# Patient Record
Sex: Female | Born: 1990 | Hispanic: No | Marital: Single | Smoking: Never smoker
Health system: Southern US, Community
[De-identification: ages and names within clinical notes are randomized; demographics above are authoritative.]

## PROBLEM LIST (undated history)

## (undated) DIAGNOSIS — R52 Pain, unspecified: Secondary | ICD-10-CM

## (undated) DIAGNOSIS — G8929 Other chronic pain: Secondary | ICD-10-CM

## (undated) DIAGNOSIS — M329 Systemic lupus erythematosus, unspecified: Secondary | ICD-10-CM

## (undated) DIAGNOSIS — IMO0002 Reserved for concepts with insufficient information to code with codable children: Secondary | ICD-10-CM

---

## 2010-04-24 ENCOUNTER — Emergency Department (HOSPITAL_COMMUNITY): Admission: EM | Admit: 2010-04-24 | Discharge: 2010-04-24 | Payer: Self-pay | Admitting: Emergency Medicine

## 2011-07-19 ENCOUNTER — Emergency Department (HOSPITAL_COMMUNITY): Payer: Self-pay

## 2011-07-19 ENCOUNTER — Emergency Department (HOSPITAL_COMMUNITY)
Admission: EM | Admit: 2011-07-19 | Discharge: 2011-07-19 | Disposition: A | Discharge status: Home / Self Care | Payer: Self-pay | Attending: Emergency Medicine | Admitting: Emergency Medicine

## 2011-07-19 ENCOUNTER — Encounter: Payer: Self-pay | Admitting: *Deleted

## 2011-07-19 DIAGNOSIS — F17200 Nicotine dependence, unspecified, uncomplicated: Secondary | ICD-10-CM | POA: Insufficient documentation

## 2011-07-19 DIAGNOSIS — S0093XA Contusion of unspecified part of head, initial encounter: Secondary | ICD-10-CM

## 2011-07-19 DIAGNOSIS — M542 Cervicalgia: Secondary | ICD-10-CM | POA: Insufficient documentation

## 2011-07-19 DIAGNOSIS — S0003XA Contusion of scalp, initial encounter: Secondary | ICD-10-CM | POA: Insufficient documentation

## 2011-07-19 DIAGNOSIS — M549 Dorsalgia, unspecified: Secondary | ICD-10-CM | POA: Insufficient documentation

## 2011-07-19 DIAGNOSIS — S1093XA Contusion of unspecified part of neck, initial encounter: Secondary | ICD-10-CM | POA: Insufficient documentation

## 2011-07-19 DIAGNOSIS — G8929 Other chronic pain: Secondary | ICD-10-CM

## 2011-07-19 MED ORDER — HYDROMORPHONE HCL 1 MG/ML IJ SOLN
1.0000 mg | Freq: Once | INTRAMUSCULAR | Status: AC
  Administered 2011-07-19: 1 mg via INTRAMUSCULAR
  Filled 2011-07-19: qty 1

## 2011-07-19 MED ORDER — CYCLOBENZAPRINE HCL 10 MG PO TABS: 10.0000 mg | ORAL_TABLET | Freq: Two times a day (BID) | ORAL | Status: AC | PRN

## 2011-07-19 MED ORDER — NAPROXEN 500 MG PO TABS: 500.0000 mg | ORAL_TABLET | Freq: Two times a day (BID) | ORAL | Status: AC

## 2011-07-19 NOTE — ED Notes (Signed)
Pt upset with wait time. Apologized to pt for delay. Explained to pt that MD would be back in to see her as soon as possible.
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
## 2011-07-19 NOTE — ED Notes (Signed)
Patient comfortable does not need anything at this time. 
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
## 2011-07-19 NOTE — ED Notes (Signed)
PT returned from CT. States pain has improved down to 5/10 after Dilaudid. Resp even and unlabored. Awaiting radiology results.
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
## 2014-05-24 ENCOUNTER — Encounter (HOSPITAL_COMMUNITY): Payer: Self-pay | Admitting: Emergency Medicine

## 2014-05-24 ENCOUNTER — Emergency Department (HOSPITAL_COMMUNITY)
Admission: EM | Admit: 2014-05-24 | Discharge: 2014-05-24 | Disposition: A | Discharge status: Home / Self Care | Payer: Self-pay | Attending: Emergency Medicine | Admitting: Emergency Medicine

## 2014-05-24 DIAGNOSIS — G8929 Other chronic pain: Secondary | ICD-10-CM | POA: Insufficient documentation

## 2014-05-24 DIAGNOSIS — F17200 Nicotine dependence, unspecified, uncomplicated: Secondary | ICD-10-CM | POA: Insufficient documentation

## 2014-05-24 DIAGNOSIS — M79609 Pain in unspecified limb: Secondary | ICD-10-CM | POA: Insufficient documentation

## 2014-05-24 DIAGNOSIS — Z79899 Other long term (current) drug therapy: Secondary | ICD-10-CM | POA: Insufficient documentation

## 2014-05-24 HISTORY — DX: Reserved for concepts with insufficient information to code with codable children: IMO0002

## 2014-05-24 HISTORY — DX: Systemic lupus erythematosus, unspecified: M32.9

## 2014-05-24 NOTE — ED Notes (Signed)
Patient has hx of lupus and now has pain all over.

## 2014-05-24 NOTE — Discharge Instructions (Signed)
Chronic Pain Discharge Instructions  °Emergency care providers appreciate that many patients coming to us are in severe pain and we wish to address their pain in the safest, most responsible manner.  It is important to recognize however, that the proper treatment of chronic pain differs from that of the pain of injuries and acute illnesses.  Our goal is to provide quality, safe, personalized care and we thank you for giving us the opportunity to serve you. °The use of narcotics and related agents for chronic pain syndromes may lead to additional physical and psychological problems.  Nearly as many people die from prescription narcotics each year as die from car crashes.  Additionally, this risk is increased if such prescriptions are obtained from a variety of sources.  Therefore, only your primary care physician or a pain management specialist is able to safely treat such syndromes with narcotic medications long-term.   ° °Documentation revealing such prescriptions have been sought from multiple sources may prohibit us from providing a refill or different narcotic medication.  Your name may be checked first through the [place] Controlled Substances Reporting System.  This database is a record of controlled substance medication prescriptions that the patient has received.  This has been established by  in an effort to eliminate the dangerous, and often life threatening, practice of obtaining multiple prescriptions from different medical providers.  ° °If you have a chronic pain syndrome (i.e. chronic headaches, recurrent back or neck pain, dental pain, abdominal or pelvis pain without a specific diagnosis, or neuropathic pain such as fibromyalgia) or recurrent visits for the same condition without an acute diagnosis, you may be treated with non-narcotics and other non-addictive medicines.  Allergic reactions or negative side effects that may be reported by a patient to such medications will not  typically lead to the use of a narcotic analgesic or other controlled substance as an alternative. °  °Patients managing chronic pain with a personal physician should have provisions in place for breakthrough pain.  If you are in crisis, you should call your physician.  If your physician directs you to the emergency department, please have the doctor call and speak to our attending physician concerning your care. °  °When patients come to the Emergency Department (ED) with acute medical conditions in which the Emergency Department physician feels appropriate to prescribe narcotic or sedating pain medication, the physician will prescribe these in very limited quantities.  The amount of these medications will last only until you can see your primary care physician in his/her office.  Any patient who returns to the ED seeking refills should expect only non-narcotic pain medications.  ° °In the event of an acute medical condition exists and the emergency physician feels it is necessary that the patient be given a narcotic or sedating medication -  a responsible adult driver should be present in the room prior to the medication being given by the nurse. °  °Prescriptions for narcotic or sedating medications that have been lost, stolen or expired will not be refilled in the Emergency Department.   ° °Patients who have chronic pain may receive non-narcotic prescriptions until seen by their primary care physician.  It is every patient’s personal responsibility to maintain active prescriptions with his or her primary care physician or specialist. °

## 2014-05-24 NOTE — ED Provider Notes (Signed)
CSN: [id number]     Arrival date & time 05/24/14  0128 History   First MD Initiated Contact with Patient 05/24/14 0131     Chief Complaint  Patient presents with  . Extremity Pain    PAIN ALL OVER  FROM LUPUS     (Consider location/radiation/quality/duration/timing/severity/associated sxs/prior Treatment) HPI Patient reports pain all over.  She states this is secondary to her lupus.  She's tried ibuprofen without improvement in her symptoms.  She's currently without a primary care physician.  If fevers or chills.  No chest pain shortness of breath.  No abdominal pain.  No nausea vomiting or diarrhea.  Pain is mild to moderate in severity. no weakness of her arms or legs   Past Medical History  Diagnosis Date  . Lupus    No past surgical history on file. No family history on file. History  Substance Use Topics  . Smoking status: Current Some Day Smoker  . Smokeless tobacco: Not on file  . Alcohol Use: No   OB History   Grav Para Term Preterm Abortions TAB SAB Ect Mult Living                 Review of Systems  All other systems reviewed and are negative.     Allergies  Review of patient's allergies indicates no known allergies.  Home Medications   Prior to Admission medications   Medication Sig Start Date End Date Taking? Authorizing Provider  OXcarbazepine (TRILEPTAL) 150 MG tablet Take 150-300 mg by mouth 2 (two) times daily. TAKE ONE TABLET BY MOUTH IN THE MORNING AND TWO TABLETS AT BEDTIME     Historical Provider, MD   BP 124/99  Pulse 85  Temp(Src) 98.3 F (36.8 C) (Oral)  Resp 18  Ht 5\' 4"  (1.626 m)  Wt 135 lb (61.236 kg)  BMI 23.16 kg/m2  SpO2 100% Physical Exam  Nursing note and vitals reviewed. Constitutional: She is oriented to person, place, and time. She appears well-developed and well-nourished. No distress.  HENT:  Head: Normocephalic and atraumatic.  Eyes: EOM are normal.  Neck: Normal range of motion.  Cardiovascular: Normal rate, regular  rhythm and normal heart sounds.   Pulmonary/Chest: Effort normal and breath sounds normal.  Abdominal: Soft. She exhibits no distension. There is no tenderness.  Musculoskeletal: Normal range of motion.  Neurological: She is alert and oriented to person, place, and time.  Skin: Skin is warm and dry.  Psychiatric: She has a normal mood and affect. Judgment normal.    ED Course  Procedures (including critical care time) Labs Review Labs Reviewed - No data to display  Imaging Review No results found.   EKG Interpretation None      MDM   Final diagnoses:  Chronic pain    Suspect chronic pain exacerbation.  No acute illness.  Medical screening examination completed.    Lyanne CoKevin M Campos, MD 05/24/14 [phone]

## 2016-12-14 ENCOUNTER — Encounter (HOSPITAL_COMMUNITY): Payer: Self-pay

## 2016-12-14 ENCOUNTER — Emergency Department (HOSPITAL_COMMUNITY)
Admission: EM | Admit: 2016-12-14 | Discharge: 2016-12-14 | Disposition: A | Discharge status: Home / Self Care | Payer: BLUE CROSS/BLUE SHIELD | Attending: Emergency Medicine | Admitting: Emergency Medicine

## 2016-12-14 DIAGNOSIS — J019 Acute sinusitis, unspecified: Secondary | ICD-10-CM | POA: Insufficient documentation

## 2016-12-14 DIAGNOSIS — F17210 Nicotine dependence, cigarettes, uncomplicated: Secondary | ICD-10-CM | POA: Diagnosis not present

## 2016-12-14 DIAGNOSIS — R05 Cough: Secondary | ICD-10-CM | POA: Diagnosis present

## 2016-12-14 MED ORDER — AMOXICILLIN-POT CLAVULANATE 500-125 MG PO TABS
1.0000 | ORAL_TABLET | Freq: Once | ORAL | Status: AC
  Administered 2016-12-14: 500 mg via ORAL

## 2016-12-14 MED ORDER — PREDNISONE 10 MG PO TABS
ORAL_TABLET | ORAL | Status: AC
  Filled 2016-12-14: qty 1

## 2016-12-14 MED ORDER — AMOXICILLIN-POT CLAVULANATE 500-125 MG PO TABS
ORAL_TABLET | ORAL | Status: AC
  Filled 2016-12-14: qty 1

## 2016-12-14 MED ORDER — PREDNISONE 50 MG PO TABS
ORAL_TABLET | ORAL | Status: AC
  Filled 2016-12-14: qty 1

## 2016-12-14 MED ORDER — PSEUDOEPHEDRINE HCL 60 MG PO TABS: 60.0000 mg | ORAL_TABLET | Freq: Four times a day (QID) | ORAL | 0 refills | Status: DC | PRN

## 2016-12-14 MED ORDER — AMOXICILLIN-POT CLAVULANATE 500-125 MG PO TABS: 1.0000 | ORAL_TABLET | Freq: Two times a day (BID) | ORAL | 0 refills | Status: DC

## 2016-12-14 MED ORDER — PREDNISONE 50 MG PO TABS
60.0000 mg | ORAL_TABLET | Freq: Once | ORAL | Status: AC
  Administered 2016-12-14: 60 mg via ORAL

## 2016-12-14 NOTE — ED Triage Notes (Signed)
My head has been bothering me and I have cough and cold symptoms that seem to be going into my chest.  The last couple of days I have had swelling around my eyes.  Friday I had a fever and the next day my eyes were swollen.  I have been using mucinex and dayquil.

## 2016-12-14 NOTE — ED Provider Notes (Signed)
[facility]EM[facility]PT Provider Note   CSN: [id number] Arrival date & time: 12/14/16  2059     History   Chief Complaint No chief complaint on file.   HPI Kara Scott is a 26 y.o. female.  HPI   Kara Scott is a 26 y.o. female who presents to the Emergency Department complaining of facial pain and sinus pressure.  She reports having nasal congestion, cough and "puffiness" around her eyes.  symptoms have been present for 4 days.  She has taken Mucinex and dayquil without relief.  Fever at onset, but not since.  She also admits to occasional cough that has been non-productive.  She denies chest pain, shortness of breath, abd pain, vomiting, headache, neck pain or stiffness     Past Medical History:  Diagnosis Date  . Lupus     There are no active problems to display for this patient.   History reviewed. No pertinent surgical history.  OB History    No data available       Home Medications    Prior to Admission medications   Medication Sig Start Date End Date Taking? Authorizing Provider  OXcarbazepine (TRILEPTAL) 150 MG tablet Take 150-300 mg by mouth 2 (two) times daily. TAKE ONE TABLET BY MOUTH IN THE MORNING AND TWO TABLETS AT BEDTIME     Historical Provider, MD    Family History No family history on file.  Social History Social History  Substance Use Topics  . Smoking status: Current Some Day Smoker    Packs/day: 0.50    Years: 5.00    Types: Cigarettes  . Smokeless tobacco: Never Used  . Alcohol use No     Allergies   Patient has no known allergies.   Review of Systems Review of Systems  Constitutional: Positive for fever. Negative for activity change, appetite change and chills.  HENT: Positive for congestion, rhinorrhea, sinus pain, sinus pressure and sore throat. Negative for facial swelling and trouble swallowing.   Eyes: Negative for visual disturbance.  Respiratory: Positive for cough. Negative for chest tightness, shortness of breath,  wheezing and stridor.   Cardiovascular: Negative for chest pain.  Gastrointestinal: Negative for abdominal pain, nausea and vomiting.  Musculoskeletal: Negative for arthralgias, neck pain and neck stiffness.  Skin: Negative.   Neurological: Negative for dizziness, weakness, numbness and headaches.  Hematological: Negative for adenopathy.  Psychiatric/Behavioral: Negative for confusion.  All other systems reviewed and are negative.    Physical Exam Updated Vital Signs BP 122/98 (BP Location: Right Arm)   Pulse 72   Temp 98.4 F (36.9 C) (Oral)   Resp 20   Ht 5\' 5"  (1.651 m)   Wt 63.5 kg   LMP 12/06/2016 (Approximate)   SpO2 100%   BMI 23.30 kg/m   Physical Exam  Constitutional: She is oriented to person, place, and time. She appears well-developed and well-nourished. No distress.  HENT:  Head: Normocephalic and atraumatic.  Right Ear: Tympanic membrane and ear canal normal.  Left Ear: Tympanic membrane and ear canal normal.  Nose: Mucosal edema present. Right sinus exhibits maxillary sinus tenderness. Left sinus exhibits maxillary sinus tenderness.  Mouth/Throat: Uvula is midline and mucous membranes are normal. No trismus in the jaw. No uvula swelling or dental caries. Posterior oropharyngeal erythema present. No oropharyngeal exudate, posterior oropharyngeal edema or tonsillar abscesses.  Eyes: Conjunctivae are normal.  Neck: Normal range of motion and phonation normal. Neck supple. No Brudzinski's sign and no Kernig's sign noted.  Cardiovascular: Normal rate,  regular rhythm and normal heart sounds.   Pulmonary/Chest: Effort normal and breath sounds normal. No respiratory distress. She has no wheezes. She has no rales.  Abdominal: Soft. She exhibits no distension. There is no tenderness. There is no rebound and no guarding.  Musculoskeletal: She exhibits no edema.  Lymphadenopathy:    She has no cervical adenopathy.  Neurological: She is alert and oriented to person, place,  and time. She exhibits normal muscle tone. Coordination normal.  Skin: Skin is warm and dry. No rash noted.  Psychiatric: She has a normal mood and affect.  Nursing note and vitals reviewed.    ED Treatments / Results  Labs (all labs ordered are listed, but only abnormal results are displayed) Labs Reviewed - No data to display  EKG  EKG Interpretation None       Radiology No results found.  Procedures Procedures (including critical care time)  Medications Ordered in ED Medications  predniSONE (DELTASONE) 50 MG tablet (not administered)  predniSONE (DELTASONE) 10 MG tablet (not administered)  amoxicillin-clavulanate (AUGMENTIN) 500-125 MG per tablet 500 mg (500 mg Oral Given 12/14/16 2157)  predniSONE (DELTASONE) tablet 60 mg (60 mg Oral Given 12/14/16 2157)     Initial Impression / Assessment and Plan / ED Course  I have reviewed the triage vital signs and the nursing notes.  Pertinent labs & imaging results that were available during my care of the patient were reviewed by me and considered in my medical decision making (see chart for details).  Clinical Course     Pt is well appearing.  Vitals stable.  Sx's likely related to sinusitis.  Agrees to OTC Afrin x 3 days then d/c, fluids.  Ibuprofen if needed.  Rx for augmentin and sudafed.    Final Clinical Impressions(s) / ED Diagnoses   Final diagnoses:  Acute sinusitis, recurrence not specified, unspecified location    New Prescriptions New Prescriptions   No medications on file     Pauline Ausammy Joni Norrod, Cordelia Poche-C 12/14/16 2215    Vanetta MuldersScott Zackowski, MD 12/19/16 [phone]

## 2016-12-14 NOTE — Discharge Instructions (Signed)
Use OTC afrin nasal spray one-two sprays to each nostril 2 times a day for 3 days then stop.  Increase fluids.  Start the Augmentin tomorrow.  Follow-up with your doctor for recheck or return here for any worsening symptoms

## 2017-08-16 ENCOUNTER — Encounter (HOSPITAL_COMMUNITY): Payer: Self-pay

## 2017-08-16 ENCOUNTER — Emergency Department (HOSPITAL_COMMUNITY)
Admission: EM | Admit: 2017-08-16 | Discharge: 2017-08-16 | Disposition: A | Discharge status: Home / Self Care | Payer: BLUE CROSS/BLUE SHIELD | Attending: Emergency Medicine | Admitting: Emergency Medicine

## 2017-08-16 DIAGNOSIS — F419 Anxiety disorder, unspecified: Secondary | ICD-10-CM

## 2017-08-16 DIAGNOSIS — R1084 Generalized abdominal pain: Secondary | ICD-10-CM

## 2017-08-16 DIAGNOSIS — Z79899 Other long term (current) drug therapy: Secondary | ICD-10-CM | POA: Insufficient documentation

## 2017-08-16 DIAGNOSIS — Z87891 Personal history of nicotine dependence: Secondary | ICD-10-CM | POA: Insufficient documentation

## 2017-08-16 DIAGNOSIS — R110 Nausea: Secondary | ICD-10-CM | POA: Insufficient documentation

## 2017-08-16 LAB — URINALYSIS, ROUTINE W REFLEX MICROSCOPIC
Bilirubin Urine: NEGATIVE
Glucose, UA: NEGATIVE mg/dL
Ketones, ur: NEGATIVE mg/dL
Leukocytes, UA: NEGATIVE
Nitrite: NEGATIVE
Protein, ur: NEGATIVE mg/dL
SPECIFIC GRAVITY, URINE: 1.02 (ref 1.005–1.030)
pH: 5 (ref 5.0–8.0)

## 2017-08-16 LAB — COMPREHENSIVE METABOLIC PANEL
ALBUMIN: 4.1 g/dL (ref 3.5–5.0)
ALT: 19 U/L (ref 14–54)
AST: 27 U/L (ref 15–41)
Alkaline Phosphatase: 43 U/L (ref 38–126)
Anion gap: 6 (ref 5–15)
BILIRUBIN TOTAL: 0.4 mg/dL (ref 0.3–1.2)
BUN: 11 mg/dL (ref 6–20)
CHLORIDE: 108 mmol/L (ref 101–111)
CO2: 25 mmol/L (ref 22–32)
CREATININE: 0.8 mg/dL (ref 0.44–1.00)
Calcium: 9 mg/dL (ref 8.9–10.3)
GFR calc Af Amer: >60 mL/min (ref >60)
GLUCOSE: 121 mg/dL — AB (ref 65–99)
POTASSIUM: 3.5 mmol/L (ref 3.5–5.1)
Sodium: 139 mmol/L (ref 135–145)
Total Protein: 7 g/dL (ref 6.5–8.1)

## 2017-08-16 LAB — CBC
HCT: 35.5 % — ABNORMAL LOW (ref 36.0–46.0)
Hemoglobin: 12 g/dL (ref 12.0–15.0)
MCH: 27.9 pg (ref 26.0–34.0)
MCHC: 33.8 g/dL (ref 30.0–36.0)
MCV: 82.6 fL (ref 78.0–100.0)
Platelets: 257 10*3/uL (ref 150–400)
RBC: 4.3 MIL/uL (ref 3.87–5.11)
RDW: 12.8 % (ref 11.5–15.5)
WBC: 7.4 10*3/uL (ref 4.0–10.5)

## 2017-08-16 LAB — RAPID URINE DRUG SCREEN, HOSP PERFORMED
AMPHETAMINES: NOT DETECTED
BENZODIAZEPINES: NOT DETECTED
Barbiturates: NOT DETECTED
COCAINE: NOT DETECTED
OPIATES: NOT DETECTED
Tetrahydrocannabinol: NOT DETECTED

## 2017-08-16 LAB — LIPASE, BLOOD: LIPASE: 24 U/L (ref 11–51)

## 2017-08-16 LAB — PREGNANCY, URINE: PREG TEST UR: NEGATIVE

## 2017-08-16 MED ORDER — SODIUM CHLORIDE 0.9 % IV BOLUS (SEPSIS)
1000.0000 mL | Freq: Once | INTRAVENOUS | Status: AC
  Administered 2017-08-16: 1000 mL via INTRAVENOUS

## 2017-08-16 MED ORDER — PROMETHAZINE HCL 25 MG PO TABS: 25.0000 mg | ORAL_TABLET | Freq: Four times a day (QID) | ORAL | 0 refills | Status: DC | PRN

## 2017-08-16 MED ORDER — ONDANSETRON HCL 4 MG/2ML IJ SOLN
4.0000 mg | Freq: Once | INTRAMUSCULAR | Status: AC
  Administered 2017-08-16: 4 mg via INTRAVENOUS
  Filled 2017-08-16: qty 2

## 2017-08-16 MED ORDER — HYDROXYZINE HCL 25 MG PO TABS: 25.0000 mg | ORAL_TABLET | Freq: Four times a day (QID) | ORAL | 0 refills | Status: DC | PRN

## 2017-08-16 MED ORDER — LORAZEPAM 2 MG/ML IJ SOLN
0.5000 mg | Freq: Once | INTRAMUSCULAR | Status: AC
  Administered 2017-08-16: 0.5 mg via INTRAVENOUS
  Filled 2017-08-16: qty 1

## 2017-08-16 NOTE — ED Provider Notes (Signed)
[facility] Provider Note   CSN: [id number] Arrival date & time: 08/16/17  1803     History   Chief Complaint Chief Complaint  Patient presents with  . Abdominal Pain  . Anxiety    HPI Kara Scott is a 26 y.o. female.  Pt presents to the ED today with abd pain, n/v, and diarrhea.  Pt also feels like her "nerves are bad."  The pt said she woke up this way this morning.  Pt does have a hx of anxiety, but she's never felt this bad with it in the past.  Pt said she's not had an appetite for the past few days.      Past Medical History:  Diagnosis Date  . Lupus     There are no active problems to display for this patient.   History reviewed. No pertinent surgical history.  OB History    No data available       Home Medications    Prior to Admission medications   Medication Sig Start Date End Date Taking? Authorizing Provider  hydrOXYzine (ATARAX/VISTARIL) 25 MG tablet Take 1 tablet (25 mg total) by mouth every 6 (six) hours as needed for anxiety. 08/16/17   Jacalyn Lefevre, MD  OXcarbazepine (TRILEPTAL) 150 MG tablet Take 150-300 mg by mouth 2 (two) times daily. TAKE ONE TABLET BY MOUTH IN THE MORNING AND TWO TABLETS AT BEDTIME     [provider]  promethazine (PHENERGAN) 25 MG tablet Take 1 tablet (25 mg total) by mouth every 6 (six) hours as needed for nausea or vomiting. 08/16/17   Jacalyn Lefevre, MD    Family History No family history on file.  Social History Social History  Substance Use Topics  . Smoking status: Former Smoker    Packs/day: 0.00    Years: 5.00    Types: Cigarettes  . Smokeless tobacco: Never Used  . Alcohol use No     Allergies   Patient has no known allergies.   Review of Systems Review of Systems  Gastrointestinal: Positive for abdominal pain, diarrhea, nausea and vomiting.  Psychiatric/Behavioral: The patient is nervous/anxious.   All other systems reviewed and are negative.    Physical Exam Updated  Vital Signs BP 137/84 (BP Location: Right Arm)   Pulse 77   Temp 98.2 F (36.8 C) (Oral)   Resp 16   Ht  (1.626 m)   Wt 63.5 kg (140 lb)   LMP 08/09/2017   SpO2 100%   BMI 24.03 kg/m   Physical Exam  Constitutional: She is oriented to person, place, and time. She appears well-developed and well-nourished.  HENT:  Head: Normocephalic and atraumatic.  Right Ear: External ear normal.  Left Ear: External ear normal.  Nose: Nose normal.  Mouth/Throat: Oropharynx is clear and moist.  Eyes: Pupils are equal, round, and reactive to light. Conjunctivae and EOM are normal.  Neck: Normal range of motion. Neck supple.  Cardiovascular: Normal rate, regular rhythm, normal heart sounds and intact distal pulses.   Pulmonary/Chest: Effort normal and breath sounds normal.  Abdominal: Soft. Bowel sounds are normal.  Musculoskeletal: Normal range of motion.  Neurological: She is alert and oriented to person, place, and time.  Skin: Skin is warm.  Psychiatric: She has a normal mood and affect. Her behavior is normal. Thought content normal.  Nursing note and vitals reviewed.    ED Treatments / Results  Labs (all labs ordered are listed, but only abnormal results are displayed) Labs Reviewed  COMPREHENSIVE METABOLIC PANEL - Abnormal; Notable for the following:       Result Value   Glucose, Bld 121 (*)    All other components within normal limits  CBC - Abnormal; Notable for the following:    HCT 35.5 (*)    All other components within normal limits  URINALYSIS, ROUTINE W REFLEX MICROSCOPIC - Abnormal; Notable for the following:    Hgb urine dipstick SMALL (*)    Bacteria, UA RARE (*)    Squamous Epithelial / LPF 0-5 (*)    All other components within normal limits  LIPASE, BLOOD  PREGNANCY, URINE  RAPID URINE DRUG SCREEN, HOSP PERFORMED    EKG  EKG Interpretation None       Radiology No results found.  Procedures Procedures (including critical care  time)  Medications Ordered in ED Medications  ondansetron (ZOFRAN) injection 4 mg (4 mg Intravenous Given 08/16/17 1909)  sodium chloride 0.9 % bolus 1,000 mL (1,000 mLs Intravenous New Bag/Given 08/16/17 1909)  LORazepam (ATIVAN) injection 0.5 mg (0.5 mg Intravenous Given 08/16/17 1909)     Initial Impression / Assessment and Plan / ED Course  I have reviewed the triage vital signs and the nursing notes.  Pertinent labs & imaging results that were available during my care of the patient were reviewed by me and considered in my medical decision making (see chart for details).    Pt feeling much better after ativan and ivfs.  She knows to return if worse.  She does not have a pcp, so [facility]  Final Clinical Impressions(s) / ED Diagnoses   Final diagnoses:  Anxiety  Generalized abdominal pain    New Prescriptions New Prescriptions   HYDROXYZINE (ATARAX/VISTARIL) 25 MG TABLET    Take 1 tablet (25 mg total) by mouth every 6 (six) hours as needed for anxiety.   PROMETHAZINE (PHENERGAN) 25 MG TABLET    Take 1 tablet (25 mg total) by mouth every 6 (six) hours as needed for nausea or vomiting.     Jacalyn LefevreHaviland, Miguelangel Korn, MD 08/16/17 [phone]

## 2017-08-16 NOTE — ED Triage Notes (Signed)
Reports of waking up today with abdominal pain, nausea and diarrhea. States her "nerves are bad." Has not taken anything for anxiety.

## 2017-11-02 ENCOUNTER — Other Ambulatory Visit: Payer: Self-pay

## 2017-11-02 ENCOUNTER — Encounter (HOSPITAL_COMMUNITY): Payer: Self-pay | Admitting: *Deleted

## 2017-11-02 ENCOUNTER — Emergency Department (HOSPITAL_COMMUNITY): Payer: Self-pay

## 2017-11-02 ENCOUNTER — Emergency Department (HOSPITAL_COMMUNITY)
Admission: EM | Admit: 2017-11-02 | Discharge: 2017-11-02 | Disposition: A | Discharge status: Home / Self Care | Payer: Self-pay | Attending: Emergency Medicine | Admitting: Emergency Medicine

## 2017-11-02 DIAGNOSIS — R0981 Nasal congestion: Secondary | ICD-10-CM | POA: Insufficient documentation

## 2017-11-02 DIAGNOSIS — R197 Diarrhea, unspecified: Secondary | ICD-10-CM | POA: Insufficient documentation

## 2017-11-02 DIAGNOSIS — B9789 Other viral agents as the cause of diseases classified elsewhere: Secondary | ICD-10-CM | POA: Insufficient documentation

## 2017-11-02 DIAGNOSIS — Z79899 Other long term (current) drug therapy: Secondary | ICD-10-CM | POA: Insufficient documentation

## 2017-11-02 DIAGNOSIS — R112 Nausea with vomiting, unspecified: Secondary | ICD-10-CM

## 2017-11-02 DIAGNOSIS — Z87891 Personal history of nicotine dependence: Secondary | ICD-10-CM | POA: Insufficient documentation

## 2017-11-02 DIAGNOSIS — J9801 Acute bronchospasm: Secondary | ICD-10-CM | POA: Insufficient documentation

## 2017-11-02 DIAGNOSIS — R05 Cough: Secondary | ICD-10-CM | POA: Insufficient documentation

## 2017-11-02 DIAGNOSIS — J069 Acute upper respiratory infection, unspecified: Secondary | ICD-10-CM | POA: Insufficient documentation

## 2017-11-02 LAB — URINALYSIS, ROUTINE W REFLEX MICROSCOPIC
Bilirubin Urine: NEGATIVE
Glucose, UA: NEGATIVE mg/dL
Ketones, ur: NEGATIVE mg/dL
Leukocytes, UA: NEGATIVE
NITRITE: NEGATIVE
PROTEIN: NEGATIVE mg/dL
Specific Gravity, Urine: 1.015 (ref 1.005–1.030)
pH: 6 (ref 5.0–8.0)

## 2017-11-02 LAB — COMPREHENSIVE METABOLIC PANEL
ALBUMIN: 4.4 g/dL (ref 3.5–5.0)
ALT: 22 U/L (ref 14–54)
ANION GAP: 9 (ref 5–15)
AST: 29 U/L (ref 15–41)
Alkaline Phosphatase: 52 U/L (ref 38–126)
BUN: 14 mg/dL (ref 6–20)
CO2: 21 mmol/L — AB (ref 22–32)
Calcium: 9.3 mg/dL (ref 8.9–10.3)
Chloride: 106 mmol/L (ref 101–111)
Creatinine, Ser: 0.79 mg/dL (ref 0.44–1.00)
GFR calc Af Amer: >60 mL/min (ref >60)
GFR calc non Af Amer: >60 mL/min (ref >60)
Glucose, Bld: 103 mg/dL — ABNORMAL HIGH (ref 65–99)
POTASSIUM: 3.6 mmol/L (ref 3.5–5.1)
SODIUM: 136 mmol/L (ref 135–145)
TOTAL PROTEIN: 7.8 g/dL (ref 6.5–8.1)
Total Bilirubin: 0.6 mg/dL (ref 0.3–1.2)

## 2017-11-02 LAB — CBC
HEMATOCRIT: 43.5 % (ref 36.0–46.0)
HEMOGLOBIN: 13.9 g/dL (ref 12.0–15.0)
MCH: 26.7 pg (ref 26.0–34.0)
MCHC: 32 g/dL (ref 30.0–36.0)
MCV: 83.7 fL (ref 78.0–100.0)
Platelets: 274 10*3/uL (ref 150–400)
RBC: 5.2 MIL/uL — ABNORMAL HIGH (ref 3.87–5.11)
RDW: 13.3 % (ref 11.5–15.5)
WBC: 5.9 10*3/uL (ref 4.0–10.5)

## 2017-11-02 LAB — LIPASE, BLOOD: LIPASE: 28 U/L (ref 11–51)

## 2017-11-02 LAB — PREGNANCY, URINE: PREG TEST UR: NEGATIVE

## 2017-11-02 MED ORDER — ALBUTEROL (5 MG/ML) CONTINUOUS INHALATION SOLN
10.0000 mg/h | INHALATION_SOLUTION | RESPIRATORY_TRACT | Status: DC
  Administered 2017-11-02: 10 mg/h via RESPIRATORY_TRACT
  Filled 2017-11-02: qty 20

## 2017-11-02 MED ORDER — ONDANSETRON HCL 4 MG PO TABS: 4.0000 mg | ORAL_TABLET | Freq: Three times a day (TID) | ORAL | 0 refills | Status: DC | PRN

## 2017-11-02 MED ORDER — ALBUTEROL SULFATE (2.5 MG/3ML) 0.083% IN NEBU
5.0000 mg | INHALATION_SOLUTION | Freq: Once | RESPIRATORY_TRACT | Status: AC
  Administered 2017-11-02: 5 mg via RESPIRATORY_TRACT
  Filled 2017-11-02: qty 6

## 2017-11-02 MED ORDER — ALBUTEROL SULFATE HFA 108 (90 BASE) MCG/ACT IN AERS: 1.0000 | INHALATION_SPRAY | RESPIRATORY_TRACT | Status: DC | PRN

## 2017-11-02 MED ORDER — SODIUM CHLORIDE 0.9 % IV BOLUS (SEPSIS)
1000.0000 mL | Freq: Once | INTRAVENOUS | Status: AC
  Administered 2017-11-02: 1000 mL via INTRAVENOUS

## 2017-11-02 MED ORDER — GI COCKTAIL ~~LOC~~
30.0000 mL | Freq: Once | ORAL | Status: AC
  Administered 2017-11-02: 30 mL via ORAL
  Filled 2017-11-02: qty 30

## 2017-11-02 MED ORDER — PREDNISONE 20 MG PO TABS: ORAL_TABLET | ORAL | 0 refills | Status: DC

## 2017-11-02 MED ORDER — ONDANSETRON HCL 4 MG/2ML IJ SOLN
4.0000 mg | Freq: Once | INTRAMUSCULAR | Status: AC
Start: 2017-11-02 — End: 2017-11-02
  Administered 2017-11-02: 4 mg via INTRAVENOUS
  Filled 2017-11-02: qty 2

## 2017-11-02 MED ORDER — METHYLPREDNISOLONE SODIUM SUCC 125 MG IJ SOLR
125.0000 mg | Freq: Once | INTRAMUSCULAR | Status: AC
  Administered 2017-11-02: 125 mg via INTRAVENOUS
  Filled 2017-11-02: qty 2

## 2017-11-02 NOTE — ED Triage Notes (Addendum)
Pt c/o 2-3 days of vomiting, diarrhea, weakness, yellow colored productive cough, wheezing, nasal congestion, chills, body aches. Unknown fever.

## 2017-11-02 NOTE — ED Provider Notes (Signed)
[facility] EMERGENCY DEPARTMENT Provider Note   CSN: [id number] Arrival date & time: 11/02/17  1217     History   Chief Complaint Chief Complaint  Patient presents with  . Cough  . Emesis    HPI Kara Scott is a 26 y.o. female.  HPI Patient presents with 3 days of vomiting and diarrhea.  Denies any blood in stool or vomit.  Multiple S episodes daily.  Patient says she is also having nasal congestion, sinus pressure, sore throat, productive cough, wheezing and shortness of breath.  Denies neck pain or stiffness. Past Medical History:  Diagnosis Date  . Lupus     There are no active problems to display for this patient.   History reviewed. No pertinent surgical history.  OB History    No data available       Home Medications    Prior to Admission medications   Medication Sig Start Date End Date Taking? Authorizing Provider  hydrOXYzine (ATARAX/VISTARIL) 25 MG tablet Take 1 tablet (25 mg total) by mouth every 6 (six) hours as needed for anxiety. 08/16/17   Jacalyn Lefevre, MD  ondansetron (ZOFRAN) 4 MG tablet Take 1 tablet (4 mg total) by mouth every 8 (eight) hours as needed for nausea or vomiting. 11/02/17   Loren Racer, MD  predniSONE (DELTASONE) 20 MG tablet 3 tabs po day one, then 2 po daily x 4 days 11/03/17   Loren Racer, MD  promethazine (PHENERGAN) 25 MG tablet Take 1 tablet (25 mg total) by mouth every 6 (six) hours as needed for nausea or vomiting. 08/16/17   Jacalyn Lefevre, MD    Family History No family history on file.  Social History Social History   Tobacco Use  . Smoking status: Former Smoker    Packs/day: 0.00    Years: 5.00    Pack years: 0.00    Types: Cigarettes  . Smokeless tobacco: Never Used  Substance Use Topics  . Alcohol use: No  . Drug use: No     Allergies   Patient has no known allergies.   Review of Systems Review of Systems  Constitutional: Negative for chills and fever.  HENT: Positive for congestion,  sinus pressure and sore throat.   Respiratory: Positive for cough, shortness of breath and wheezing.   Cardiovascular: Negative for chest pain, palpitations and leg swelling.  Gastrointestinal: Positive for diarrhea, nausea and vomiting. Negative for abdominal pain and blood in stool.  Genitourinary: Negative for dysuria and flank pain.  Musculoskeletal: Negative for back pain, joint swelling, myalgias, neck pain and neck stiffness.  Skin: Negative for rash and wound.  Neurological: Negative for dizziness, weakness, light-headedness, numbness and headaches.  All other systems reviewed and are negative.    Physical Exam Updated Vital Signs BP 114/70 (BP Location: Right Arm)   Pulse 95   Temp 97.6 F (36.4 C) (Oral)   Resp 18   Ht 5\' 4"  (1.626 m)   Wt 63.5 kg (140 lb)   LMP  (Within Weeks)   SpO2 99%   BMI 24.03 kg/m   Physical Exam  Constitutional: She is oriented to person, place, and time. She appears well-developed and well-nourished.  HENT:  Head: Normocephalic and atraumatic.  Mouth/Throat: Oropharynx is clear and moist.  Bilateral nasal mucosal edema.  Oropharynx is mildly erythematous.  Uvula is midline.  No tonsillar exudates.  Eyes: EOM are normal. Pupils are equal, round, and reactive to light.  Neck: Normal range of motion. Neck supple.  No  meningismus  Cardiovascular: Normal rate and regular rhythm. Exam reveals no gallop and no friction rub.  No murmur heard. Pulmonary/Chest: Effort normal. She has wheezes.  Diffuse expiratory wheezing throughout.  Abdominal: Soft. Bowel sounds are normal. There is tenderness. There is no rebound and no guarding.  Mild epigastric tenderness with palpation.  Musculoskeletal: Normal range of motion. She exhibits no edema or tenderness.  No midline thoracic or lumbar tenderness.  No CVA tenderness.  No lower extremity swelling, asymmetry or tenderness.  Lymphadenopathy:    She has no cervical adenopathy.  Neurological: She is  alert and oriented to person, place, and time.  5/5 motor in all extremities.  Sensation fully intact.  Skin: Skin is warm and dry. Capillary refill takes less than 2 seconds. No rash noted. No erythema.  Psychiatric: She has a normal mood and affect. Her behavior is normal.  Nursing note and vitals reviewed.    ED Treatments / Results  Labs (all labs ordered are listed, but only abnormal results are displayed) Labs Reviewed  COMPREHENSIVE METABOLIC PANEL - Abnormal; Notable for the following components:      Result Value   CO2 21 (*)    Glucose, Bld 103 (*)    All other components within normal limits  CBC - Abnormal; Notable for the following components:   RBC 5.20 (*)    All other components within normal limits  URINALYSIS, ROUTINE W REFLEX MICROSCOPIC - Abnormal; Notable for the following components:   Hgb urine dipstick SMALL (*)    Bacteria, UA RARE (*)    Squamous Epithelial / LPF 0-5 (*)    All other components within normal limits  LIPASE, BLOOD  PREGNANCY, URINE    EKG  EKG Interpretation None       Radiology Dg Chest 2 View  Result Date: 11/02/2017 CLINICAL DATA:  Flu like symptoms, vomiting, weakness, shortness of breath, wheezing, productive cough, and dehydration for 2-3 days, history asthma EXAM: CHEST  2 VIEW COMPARISON:  None FINDINGS: Normal heart size, mediastinal contours, and pulmonary vascularity. Mild peribronchial thickening. Mild hyperinflation. No pulmonary infiltrate, pleural effusion, or pneumothorax. Bones unremarkable. IMPRESSION: Peribronchial thickening which could reflect bronchitis or asthma. No acute infiltrate. Electronically Signed   By: Ulyses SouthwardMark  Boles M.D.   On: 11/02/2017 13:27    Procedures Procedures (including critical care time)  Medications Ordered in ED Medications  albuterol (PROVENTIL,VENTOLIN) solution continuous neb (10 mg/hr Nebulization New Bag/Given 11/02/17 1709)  albuterol (PROVENTIL HFA;VENTOLIN HFA) 108 (90 Base)  MCG/ACT inhaler 1-2 puff (not administered)  methylPREDNISolone sodium succinate (SOLU-MEDROL) 125 mg/2 mL injection 125 mg (125 mg Intravenous Given 11/02/17 1512)  sodium chloride 0.9 % bolus 1,000 mL (0 mLs Intravenous Stopped 11/02/17 1642)  albuterol (PROVENTIL) (2.5 MG/3ML) 0.083% nebulizer solution 5 mg (5 mg Nebulization Given 11/02/17 1523)  gi cocktail (Maalox,Lidocaine,Donnatal) (30 mLs Oral Given 11/02/17 1512)  ondansetron (ZOFRAN) injection 4 mg (4 mg Intravenous Given 11/02/17 1512)     Initial Impression / Assessment and Plan / ED Course  I have reviewed the triage vital signs and the nursing notes.  Pertinent labs & imaging results that were available during my care of the patient were reviewed by me and considered in my medical decision making (see chart for details).     Patient told nursing staff that she had a syncopal episode yesterday.  EKG without acute findings.  Will treat for asthma exacerbation/bronchospasm as well as possible mild dehydration due to gastrointestinal illness. She is feeling much better  after nebulized treatment.  No further vomiting.  Vital signs remained stable.  Return precautions given. Final Clinical Impressions(s) / ED Diagnoses   Final diagnoses:  Nausea vomiting and diarrhea  Viral URI with cough  Bronchospasm    ED Discharge Orders        Ordered    predniSONE (DELTASONE) 20 MG tablet     11/02/17 1754    ondansetron (ZOFRAN) 4 MG tablet  Every 8 hours PRN     11/02/17 1754       Loren RacerYelverton, Kaia Depaolis, MD 11/02/17 1755

## 2017-11-07 ENCOUNTER — Encounter (HOSPITAL_COMMUNITY): Payer: Self-pay

## 2017-11-07 ENCOUNTER — Other Ambulatory Visit: Payer: Self-pay

## 2017-11-07 ENCOUNTER — Emergency Department (HOSPITAL_COMMUNITY)
Admission: EM | Admit: 2017-11-07 | Discharge: 2017-11-07 | Disposition: A | Discharge status: Home / Self Care | Payer: Self-pay | Attending: Emergency Medicine | Admitting: Emergency Medicine

## 2017-11-07 ENCOUNTER — Emergency Department (HOSPITAL_COMMUNITY): Payer: Self-pay

## 2017-11-07 DIAGNOSIS — J40 Bronchitis, not specified as acute or chronic: Secondary | ICD-10-CM

## 2017-11-07 DIAGNOSIS — J069 Acute upper respiratory infection, unspecified: Secondary | ICD-10-CM | POA: Insufficient documentation

## 2017-11-07 DIAGNOSIS — J209 Acute bronchitis, unspecified: Secondary | ICD-10-CM | POA: Insufficient documentation

## 2017-11-07 DIAGNOSIS — Z87891 Personal history of nicotine dependence: Secondary | ICD-10-CM | POA: Insufficient documentation

## 2017-11-07 MED ORDER — LORATADINE-PSEUDOEPHEDRINE ER 5-120 MG PO TB12
1.0000 | ORAL_TABLET | Freq: Two times a day (BID) | ORAL | 0 refills | Status: DC
Start: 2017-11-07 — End: 2019-05-02

## 2017-11-07 MED ORDER — DEXAMETHASONE 4 MG PO TABS: 4.0000 mg | ORAL_TABLET | Freq: Two times a day (BID) | ORAL | 0 refills | Status: DC

## 2017-11-07 MED ORDER — IBUPROFEN 400 MG PO TABS
400.0000 mg | ORAL_TABLET | Freq: Once | ORAL | Status: AC
  Administered 2017-11-07: 400 mg via ORAL
  Filled 2017-11-07: qty 1

## 2017-11-07 MED ORDER — PREDNISONE 20 MG PO TABS
40.0000 mg | ORAL_TABLET | Freq: Once | ORAL | Status: AC
  Administered 2017-11-07: 40 mg via ORAL
  Filled 2017-11-07: qty 2

## 2017-11-07 MED ORDER — ALBUTEROL SULFATE HFA 108 (90 BASE) MCG/ACT IN AERS
2.0000 | INHALATION_SPRAY | Freq: Once | RESPIRATORY_TRACT | Status: AC
Start: 2017-11-07 — End: 2017-11-07
  Administered 2017-11-07: 2 via RESPIRATORY_TRACT
  Filled 2017-11-07: qty 6.7

## 2017-11-07 MED ORDER — PSEUDOEPHEDRINE HCL 60 MG PO TABS
60.0000 mg | ORAL_TABLET | Freq: Once | ORAL | Status: AC
  Administered 2017-11-07: 60 mg via ORAL
  Filled 2017-11-07: qty 1

## 2017-11-07 NOTE — ED Provider Notes (Signed)
[facility] EMERGENCY DEPARTMENT Provider Note   CSN: [id number] Arrival date & time: 11/07/17  2006     History   Chief Complaint Chief Complaint  Patient presents with  . Cough    and congestion    HPI Kara Scott is a 26 y.o. female.  Patient is a 26 year old female who presents to the emergency department with a complaint of cough and congestion.  The patient states that she has been having problems since before November 28.  She was evaluated and treated here in the emergency department.  She was told that she may have other lung symptoms.  She was asked to follow-up with primary physician or return to the emergency department.  She states she does not have a primary physician right now so she came back to the emergency department.  She states that she has frequent congestion, she coughs up a whitish-looking phlegm, and at times she says her chest just does not feel quite right.  No blood in the phlegm.  No high fever reported.  She feels that she should have gotten an antibiotic when she was here in December.  She presents now for reevaluation of this issue.   The history is provided by the patient.    Past Medical History:  Diagnosis Date  . Lupus     There are no active problems to display for this patient.   History reviewed. No pertinent surgical history.  OB History    No data available       Home Medications    Prior to Admission medications   Medication Sig Start Date End Date Taking? Authorizing Provider  ondansetron (ZOFRAN) 4 MG tablet Take 1 tablet (4 mg total) by mouth every 8 (eight) hours as needed for nausea or vomiting. 11/02/17  Yes Loren Racer, MD  predniSONE (DELTASONE) 20 MG tablet 3 tabs po day one, then 2 po daily x 4 days Patient taking differently: Take 20 mg by mouth See admin instructions. 3 tabs po day one, then 2 po daily x 4 days starting on 11/02/2017 11/03/17  Yes Loren Racer, MD  hydrOXYzine (ATARAX/VISTARIL) 25 MG  tablet Take 1 tablet (25 mg total) by mouth every 6 (six) hours as needed for anxiety. Patient not taking: Reported on 11/07/2017 08/16/17   Jacalyn Lefevre, MD  promethazine (PHENERGAN) 25 MG tablet Take 1 tablet (25 mg total) by mouth every 6 (six) hours as needed for nausea or vomiting. Patient not taking: Reported on 11/07/2017 08/16/17   Jacalyn Lefevre, MD    Family History No family history on file.  Social History Social History   Tobacco Use  . Smoking status: Former Smoker    Packs/day: 0.00    Years: 5.00    Pack years: 0.00    Types: Cigarettes  . Smokeless tobacco: Never Used  Substance Use Topics  . Alcohol use: No  . Drug use: No     Allergies   Patient has no known allergies.   Review of Systems Review of Systems  Constitutional: Negative for activity change and appetite change.       All ROS Neg except as noted in HPI  HENT: Positive for congestion. Negative for nosebleeds.   Eyes: Negative for photophobia and discharge.  Respiratory: Positive for cough. Negative for shortness of breath and wheezing.        Chest wall soreness  Cardiovascular: Negative for chest pain and palpitations.  Gastrointestinal: Positive for diarrhea. Negative for abdominal pain and blood  in stool.  Genitourinary: Negative for dysuria, frequency and hematuria.  Musculoskeletal: Positive for myalgias. Negative for arthralgias, back pain and neck pain.  Skin: Negative.   Neurological: Negative for dizziness, seizures and speech difficulty.  Psychiatric/Behavioral: Negative for confusion and hallucinations.     Physical Exam Updated Vital Signs BP 116/75 (BP Location: Right Arm)   Pulse 84   Temp 98.9 F (37.2 C) (Oral)   Resp 16   Ht 5\' 4"  (1.626 m)   Wt 63.5 kg (140 lb)   LMP 11/07/2017   SpO2 95%   BMI 24.03 kg/m   Physical Exam  Constitutional: She is oriented to person, place, and time. She appears well-developed and well-nourished.  Non-toxic appearance.  HENT:    Head: Normocephalic.  Right Ear: Tympanic membrane and external ear normal.  Left Ear: Tympanic membrane and external ear normal.  Nasal congestion present.  The airway is patent.  Eyes: EOM and lids are normal. Pupils are equal, round, and reactive to light.  Neck: Normal range of motion. Neck supple. Carotid bruit is not present.  Cardiovascular: Normal rate, regular rhythm, normal heart sounds, intact distal pulses and normal pulses.  Pulmonary/Chest: Breath sounds normal. No respiratory distress.  Chest wall soreness, right greater than left.  Coarse breath sounds with occasional wheeze and rhonchi.  There is symmetrical rise and fall of the chest.  The patient speaks in complete sentences without problem.  Abdominal: Soft. Bowel sounds are normal. There is no tenderness. There is no guarding.  Musculoskeletal: Normal range of motion.  Lymphadenopathy:       Head (right side): No submandibular adenopathy present.       Head (left side): No submandibular adenopathy present.    She has no cervical adenopathy.  Neurological: She is alert and oriented to person, place, and time. She has normal strength. No cranial nerve deficit or sensory deficit.  Skin: Skin is warm and dry. No rash noted.  Psychiatric: She has a normal mood and affect. Her speech is normal.  Nursing note and vitals reviewed.    ED Treatments / Results  Labs (all labs ordered are listed, but only abnormal results are displayed) Labs Reviewed - No data to display  EKG  EKG Interpretation None       Radiology Dg Chest 2 View  Result Date: 11/07/2017 CLINICAL DATA:  26 year old female with cough and congestion. EXAM: CHEST  2 VIEW COMPARISON:  Chest radiograph dated 11/02/2017 FINDINGS: The heart size and mediastinal contours are within normal limits. Both lungs are clear. The visualized skeletal structures are unremarkable. IMPRESSION: No active cardiopulmonary disease. Electronically Signed   By: Elgie CollardArash   Radparvar M.D.   On: 11/07/2017 22:06    Procedures Procedures (including critical care time)  Medications Ordered in ED Medications - No data to display   Initial Impression / Assessment and Plan / ED Course  I have reviewed the triage vital signs and the nursing notes.  Pertinent labs & imaging results that were available during my care of the patient were reviewed by me and considered in my medical decision making (see chart for details).       Final Clinical Impressions(s) / ED Diagnoses MDM Vital signs are within normal limits.  Pulse oximetry is 95% on room air.  Within normal limits by my interpretation.  The patient speaks in complete sentences without problem.  She does not use accessory muscles to assist with her breathing and she is in no distress.  The chest x-ray  shows no active cardiopulmonary disease.  I reviewed the examination with the patient in terms of which she understood.  The patient will be treated with albuterol, steroids, and decongesting medication.  Patient will return to the emergency department if any changes, problems, or concerns for reevaluation and management.   Final diagnoses:  Bronchitis  Upper respiratory tract infection, unspecified type    ED Discharge Orders        Ordered    loratadine-pseudoephedrine (CLARITIN-D 12 HOUR) 5-120 MG tablet  2 times daily     11/07/17 2252    dexamethasone (DECADRON) 4 MG tablet  2 times daily with meals     11/07/17 2252       Ivery QualeBryant, Sharanya Templin, PA-C 11/07/17 2255    Doug SouJacubowitz, Sam, MD 11/08/17 [phone]

## 2017-11-07 NOTE — ED Triage Notes (Signed)
Pt reports cough and congestion 11/28 and evaluated, given breathing treatments. No Rx given per pt report. Pt reports sx are getting better, but she's still worried about her symptoms and thinks maybe she should have gotten anbx, reports taking prednisone prescribed at discharge.

## 2017-11-07 NOTE — Discharge Instructions (Signed)
Your chest x-ray is negative for acute problem.  Your oxygen level is 95% on room air.  You have some congestion present with an occasional wheeze present on your lung examination.  Please use 2 puffs of albuterol every 4 hours.  Please use Decadron 2 times daily with food.  Please use Claritin-D every 12 hours for nasal congestion.  This will also assist with the cough.  It is important that you increase your fluids.  Especially water, juices, Gatorade, Kool-Aid, etc.  Please wash hands frequently.  See your primary physician or return to the emergency department if not improving.

## 2019-05-02 ENCOUNTER — Other Ambulatory Visit: Payer: Self-pay

## 2019-05-02 ENCOUNTER — Emergency Department (HOSPITAL_COMMUNITY)
Admission: EM | Admit: 2019-05-02 | Discharge: 2019-05-02 | Disposition: A | Discharge status: Home / Self Care | Payer: Self-pay | Attending: Emergency Medicine | Admitting: Emergency Medicine

## 2019-05-02 ENCOUNTER — Encounter (HOSPITAL_COMMUNITY): Payer: Self-pay | Admitting: Emergency Medicine

## 2019-05-02 ENCOUNTER — Emergency Department (HOSPITAL_COMMUNITY): Payer: Self-pay

## 2019-05-02 DIAGNOSIS — Y929 Unspecified place or not applicable: Secondary | ICD-10-CM | POA: Insufficient documentation

## 2019-05-02 DIAGNOSIS — S46911A Strain of unspecified muscle, fascia and tendon at shoulder and upper arm level, right arm, initial encounter: Secondary | ICD-10-CM | POA: Insufficient documentation

## 2019-05-02 DIAGNOSIS — Y999 Unspecified external cause status: Secondary | ICD-10-CM | POA: Insufficient documentation

## 2019-05-02 DIAGNOSIS — Z79899 Other long term (current) drug therapy: Secondary | ICD-10-CM | POA: Insufficient documentation

## 2019-05-02 DIAGNOSIS — Z87891 Personal history of nicotine dependence: Secondary | ICD-10-CM | POA: Insufficient documentation

## 2019-05-02 DIAGNOSIS — Y9389 Activity, other specified: Secondary | ICD-10-CM | POA: Insufficient documentation

## 2019-05-02 DIAGNOSIS — X500XXA Overexertion from strenuous movement or load, initial encounter: Secondary | ICD-10-CM | POA: Insufficient documentation

## 2019-05-02 MED ORDER — HYDROCODONE-ACETAMINOPHEN 5-325 MG PO TABS: 1.0000 | ORAL_TABLET | ORAL | 0 refills | Status: DC | PRN

## 2019-05-02 MED ORDER — HYDROCODONE-ACETAMINOPHEN 5-325 MG PO TABS
1.0000 | ORAL_TABLET | Freq: Once | ORAL | Status: AC
  Administered 2019-05-02: 1 via ORAL
  Filled 2019-05-02: qty 1

## 2019-05-02 MED ORDER — IBUPROFEN 800 MG PO TABS: 800.0000 mg | ORAL_TABLET | Freq: Three times a day (TID) | ORAL | 0 refills | Status: DC

## 2019-05-02 NOTE — ED Provider Notes (Signed)
[facility] EMERGENCY DEPARTMENT Provider Note   CSN: [id number] Arrival date & time: 05/02/19  2015    History   Chief Complaint Chief Complaint  Patient presents with  . Shoulder Pain    HPI Kara FortLinda S Dupas is a 28 y.o. female.     HPI   Kara Scott is a 28 y.o. female who presents to the Emergency Department complaining of right shoulder pain for one day.  She states that she was helping someone move a trailer yesterday when the other person dropped their end causing the weight to shift to her side.  She describes a sudden jerking or pulling on her shoulder and states she felt a "pop" since then she reports throbbing pain to the front of her shoulder that radiates to her neck.  Pain worsens when she attempts to raise her arm.  Pain improves slightly when at rest.  She denies numbness or weakness of her arms, pain radiating into her arm, headaches and chest pain.  She has taken ibuprofen w/o relief.     Past Medical History:  Diagnosis Date  . Lupus (HCC)     There are no active problems to display for this patient.   History reviewed. No pertinent surgical history.   OB History   No obstetric history on file.      Home Medications    Prior to Admission medications   Medication Sig Start Date End Date Taking? Authorizing Provider  dexamethasone (DECADRON) 4 MG tablet Take 1 tablet (4 mg total) by mouth 2 (two) times daily with a meal. 11/07/17   Ivery QualeBryant, Hobson, PA-C  hydrOXYzine (ATARAX/VISTARIL) 25 MG tablet Take 1 tablet (25 mg total) by mouth every 6 (six) hours as needed for anxiety. Patient not taking: Reported on 11/07/2017 08/16/17   Jacalyn LefevreHaviland, Julie, MD  loratadine-pseudoephedrine (CLARITIN-D 12 HOUR) 5-120 MG tablet Take 1 tablet by mouth 2 (two) times daily. 11/07/17   Ivery QualeBryant, Hobson, PA-C  ondansetron (ZOFRAN) 4 MG tablet Take 1 tablet (4 mg total) by mouth every 8 (eight) hours as needed for nausea or vomiting. 11/02/17   Loren RacerYelverton, David, MD  predniSONE  (DELTASONE) 20 MG tablet 3 tabs po day one, then 2 po daily x 4 days Patient taking differently: Take 20 mg by mouth See admin instructions. 3 tabs po day one, then 2 po daily x 4 days starting on 11/02/2017 11/03/17   Loren RacerYelverton, David, MD  promethazine (PHENERGAN) 25 MG tablet Take 1 tablet (25 mg total) by mouth every 6 (six) hours as needed for nausea or vomiting. Patient not taking: Reported on 11/07/2017 08/16/17   Jacalyn LefevreHaviland, Julie, MD    Family History History reviewed. No pertinent family history.  Social History Social History   Tobacco Use  . Smoking status: Former Smoker    Packs/day: 0.00    Years: 5.00    Pack years: 0.00    Types: Cigarettes  . Smokeless tobacco: Never Used  Substance Use Topics  . Alcohol use: No  . Drug use: No     Allergies   Patient has no known allergies.   Review of Systems Review of Systems  Constitutional: Negative for chills and fever.  Respiratory: Negative for shortness of breath.   Cardiovascular: Negative for chest pain.  Gastrointestinal: Negative for abdominal pain, nausea and vomiting.  Musculoskeletal: Positive for arthralgias (right shoulder pain) and neck pain. Negative for back pain and joint swelling.  Skin: Negative for color change and wound.  Neurological: Negative for dizziness,  syncope, weakness, numbness and headaches.     Physical Exam Updated Vital Signs BP (!) 138/94   Pulse 78   Temp 98.3 F (36.8 C)   Resp 20   Ht 5\' 5"  (1.651 m)   Wt 59 kg   LMP 04/23/2019   SpO2 100%   BMI 21.63 kg/m   Physical Exam Vitals signs and nursing note reviewed.  Constitutional:      Appearance: Normal appearance. She is not ill-appearing.  HENT:     Head: Atraumatic.  Neck:     Musculoskeletal: Normal range of motion. Muscular tenderness present.     Comments: ttp of the right cervical paraspinal muscles.  No spinal tenderness or step offs Cardiovascular:     Rate and Rhythm: Normal rate and regular rhythm.      Pulses: Normal pulses.  Pulmonary:     Effort: Pulmonary effort is normal.     Breath sounds: Normal breath sounds.  Chest:     Chest wall: No tenderness.  Musculoskeletal:        General: Tenderness and signs of injury present.     Right shoulder: She exhibits tenderness. She exhibits no bony tenderness, no swelling, no crepitus, no deformity and normal pulse.     Comments: Diffuse ttp of the anterior right shoulder joint and trapezius muscle.  Pain through ROM of the shoulder joint.  Grip strength is strong and symmetric.    Skin:    General: Skin is warm.     Findings: No erythema or rash.  Neurological:     General: No focal deficit present.     Mental Status: She is alert.     GCS: GCS eye subscore is 4. GCS verbal subscore is 5. GCS motor subscore is 6.     Sensory: Sensation is intact.     Motor: Motor function is intact.     Comments: CN II-XII grossly intact      ED Treatments / Results  Labs (all labs ordered are listed, but only abnormal results are displayed) Labs Reviewed - No data to display  EKG None  Radiology Dg Shoulder Right  Result Date: 05/02/2019 CLINICAL DATA:  Lifting injury EXAM: RIGHT SHOULDER - 2+ VIEW COMPARISON:  None. FINDINGS: There is no evidence of fracture or dislocation. There is no evidence of arthropathy or other focal bone abnormality. Soft tissues are unremarkable. IMPRESSION: Negative. Electronically Signed   By: Deatra Robinson M.D.   On: 05/02/2019 22:05    Procedures Procedures (including critical care time)  Medications Ordered in ED Medications  HYDROcodone-acetaminophen (NORCO/VICODIN) 5-325 MG per tablet 1 tablet (has no administration in time range)     Initial Impression / Assessment and Plan / ED Course  I have reviewed the triage vital signs and the nursing notes.  Pertinent labs & imaging results that were available during my care of the patient were reviewed by me and considered in my medical decision making (see  chart for details).        Pt with likely musculoskeletal injury.  XR reassuring.  NV intact.  She agrees to tx plan which includes ice, NSAID and orthopedic f/u in one week if not improving.    Final Clinical Impressions(s) / ED Diagnoses   Final diagnoses:  Strain of right shoulder, initial encounter    ED Discharge Orders    None       Rosey Bath 05/02/19 2229    Donnetta Hutching, MD 05/03/19 1700

## 2019-05-02 NOTE — ED Triage Notes (Signed)
Pt c/o right shoulder pain that radiates to back and neck since lifting trailer yesterday.

## 2019-05-02 NOTE — Discharge Instructions (Addendum)
Apply ice packs on/off to your shoulder.  Call one of the orthopedic providers listed to arrange a follow-up appt.

## 2019-05-03 MED FILL — Hydrocodone-Acetaminophen Tab 5-325 MG: ORAL | Qty: 6 | Status: AC

## 2019-07-05 ENCOUNTER — Telehealth: Payer: Self-pay

## 2019-07-05 NOTE — Telephone Encounter (Signed)
Patient called wanting to be seen by you. She was seen in [facility] ER on 05/02/19 for R Shoulder Pain. They told her to follow up with orthopedic in a week and referred her to Dr. Marlou Sa or you. She has not been seen by anyone since that ER visit for this. I explained to her that you was not on call that day and would need to speak with you before scheduling, she was in agreement with this.  Please review and let me know if will see her and when.

## 2019-07-05 NOTE — Telephone Encounter (Signed)
Weds aug 5th in one of the new slots

## 2019-07-11 ENCOUNTER — Ambulatory Visit (INDEPENDENT_AMBULATORY_CARE_PROVIDER_SITE_OTHER): Payer: Self-pay | Admitting: Orthopedic Surgery

## 2019-07-11 ENCOUNTER — Other Ambulatory Visit: Payer: Self-pay

## 2019-07-11 ENCOUNTER — Encounter: Payer: Self-pay | Admitting: Orthopedic Surgery

## 2019-07-11 VITALS — BP 138/92 | HR 76 | Ht 65.0 in | Wt 117.0 lb

## 2019-07-11 DIAGNOSIS — M62838 Other muscle spasm: Secondary | ICD-10-CM

## 2019-07-11 DIAGNOSIS — M7581 Other shoulder lesions, right shoulder: Secondary | ICD-10-CM

## 2019-07-11 DIAGNOSIS — M25511 Pain in right shoulder: Secondary | ICD-10-CM

## 2019-07-11 MED ORDER — ACETAMINOPHEN-CODEINE #3 300-30 MG PO TABS: 1.0000 | ORAL_TABLET | Freq: Four times a day (QID) | ORAL | 0 refills | Status: DC | PRN

## 2019-07-11 MED ORDER — CYCLOBENZAPRINE HCL 5 MG PO TABS: 5.0000 mg | ORAL_TABLET | Freq: Three times a day (TID) | ORAL | 0 refills | Status: DC | PRN

## 2019-07-11 MED ORDER — PREDNISONE 10 MG PO TABS: 10.0000 mg | ORAL_TABLET | Freq: Every day | ORAL | 0 refills | Status: DC

## 2019-07-11 NOTE — Patient Instructions (Addendum)
You have received an injection of steroids into the joint. 15% of patients will have increased pain within the 24 hours postinjection.   This is transient and will go away.   We recommend that you use ice packs on the injection site for 20 minutes every 2 hours and extra strength Tylenol 2 tablets every 8 as needed until the pain resolves.  If you continue to have pain after taking the Tylenol and using the ice please call the office for further instructions.    Rotator Cuff Tendinitis  Rotator cuff tendinitis is inflammation of the tough, cord-like bands that connect muscle to bone (tendons) in the rotator cuff. The rotator cuff includes all of the muscles and tendons that connect the arm to the shoulder. The rotator cuff holds the head of the upper arm bone (humerus) in the cup (fossa) of the shoulder blade (scapula). This condition can lead to a long-lasting (chronic) tear. The tear may be partial or complete. What are the causes? This condition is usually caused by overusing the rotator cuff. What increases the risk? This condition is more likely to develop in athletes and workers who frequently use their shoulder or reach over their heads. This can include activities such as:  Tennis.  Baseball or softball.  Swimming.  Construction work.  Painting. What are the signs or symptoms? Symptoms of this condition include:  Pain spreading (radiating) from the shoulder to the upper arm.  Swelling and tenderness in front of the shoulder.  Pain when reaching, pulling, or lifting the arm above the head.  Pain when lowering the arm from above the head.  Minor pain in the shoulder when resting.  Increased pain in the shoulder at night.  Difficulty placing the arm behind the back. How is this diagnosed? This condition is diagnosed with a medical history and physical exam. Tests may also be done, including:  X-rays.  MRI.  Ultrasounds.  CT or MR arthrogram. During this test,  a contrast material is injected and then images are taken. How is this treated? Treatment for this condition depends on the severity of the condition. In less severe cases, treatment may include:  Rest. This may be done with a sling that holds the shoulder still (immobilization). Your health care provider may also recommend avoiding activities that involve lifting your arm over your head.  Icing the shoulder.  Anti-inflammatory medicines, such as aspirin or ibuprofen. In more severe cases, treatment may include:  Physical therapy.  Steroid injections.  Surgery. Follow these instructions at home: If you have a sling:  Wear the sling as told by your health care provider. Remove it only as told by your health care provider.  Loosen the sling if your fingers tingle, become numb, or turn cold and blue.  Keep the sling clean.  If the sling is not waterproof, do not let it get wet. Remove it, if allowed, or cover it with a watertight covering when you take a bath or shower. Managing pain, stiffness, and swelling  If directed, put ice on the injured area. ? If you have a removable sling, remove it as told by your health care provider. ? Put ice in a plastic bag. ? Place a towel between your skin and the bag. ? Leave the ice on for 20 minutes, 2-3 times a day.  Move your fingers often to avoid stiffness and to lessen swelling.  Raise (elevate) the injured area above the level of your heart while you are lying down.  Find a comfortable sleeping position or sleep on a recliner, if available. Driving  Do not drive or use heavy machinery while taking prescription pain medicine.  Ask your health care provider when it is safe to drive if you have a sling on your arm. Activity  Rest your shoulder as told by your health care provider.  Return to your normal activities as told by your health care provider. Ask your health care provider what activities are safe for you.  Do any  exercises or stretches as told by your health care provider.  If you do repetitive overhead tasks, take small breaks in between and include stretching exercises as told by your health care provider. General instructions  Do not use any products that contain nicotine or tobacco, such as cigarettes and e-cigarettes. These can delay healing. If you need help quitting, ask your health care provider.  Take over-the-counter and prescription medicines only as told by your health care provider.  Keep all follow-up visits as told by your health care provider. This is important. Contact a health care provider if:  Your pain gets worse.  You have new pain in your arm, hands, or fingers.  Your pain is not relieved with medicine or does not get better after 6 weeks of treatment.  You have cracking sensations when moving your shoulder in certain directions.  You hear a snapping sound after using your shoulder, followed by severe pain and weakness. Get help right away if:  Your arm, hand, or fingers are numb or tingling.  Your arm, hand, or fingers are swollen or painful or they turn white or blue. Summary  Rotator cuff tendinitis is inflammation of the tough, cord-like bands that connect muscle to bone (tendons) in the rotator cuff.  This condition is usually caused by overusing the rotator cuff, which includes all of the muscles and tendons that connect the arm to the shoulder.  This condition is more likely to develop in athletes and workers who frequently use their shoulder or reach over their heads.  Treatment generally includes rest, anti-inflammatory medicines, and icing. In some cases, physical therapy and steroid injections may be needed. In severe cases, surgery may be needed. This information is not intended to replace advice given to you by your health care provider. Make sure you discuss any questions you have with your health care provider. Document Released: 02/12/2004 Document  Revised: 03/16/2019 Document Reviewed: 11/08/2016 Elsevier Patient Education  2020 [employer]

## 2019-07-11 NOTE — Progress Notes (Signed)
CICELY ORTNER  07/11/2019  HISTORY SECTION :  Chief Complaint  Patient presents with  . Shoulder Pain    right shoulder pain since May / has lupus thinks from lupus    HPI  (Taken from er record) Kara Scott is a 28 y.o. female who presents to the Emergency Department complaining of right shoulder pain for one day.  She states that she was helping someone move a trailer yesterday when the other person dropped their end causing the weight to shift to her side.  She describes a sudden jerking or pulling on her shoulder and states she felt a "pop" since then she reports throbbing pain to the front of her shoulder that radiates to her neck.  Pain worsens when she attempts to raise her arm.  Pain improves slightly when at rest.  She denies numbness or weakness of her arms, pain radiating into her arm, headaches and chest pain.  She has taken ibuprofen w/o relief.      The patient presents for evaluation of persistent right shoulder pain and stiffness.   Review of Systems  Constitutional: Negative for chills and fever.  Musculoskeletal: Positive for joint pain.     Past Medical History:  Diagnosis Date  . Lupus ([place])     History reviewed. No pertinent surgical history.   No Known Allergies   Current Outpatient Medications:  .  ibuprofen (ADVIL) 800 MG tablet, Take 1 tablet (800 mg total) by mouth 3 (three) times daily. Take with food, Disp: 21 tablet, Rfl: 0   PHYSICAL EXAM SECTION: 1) BP (!) 138/92   Pulse 76   Ht 5\' 5"  (1.651 m)   Wt 117 lb (53.1 kg)   BMI 19.47 kg/m   Body mass index is 19.47 kg/m. General appearance: Well-developed well-nourished no gross deformities  2) Cardiovascular normal pulse and perfusion in all 4 extremities normal color without edema  3) Neurologically deep tendon reflexes are equal and normal, no sensation loss or deficits no pathologic reflexes  4) Psychological: Awake alert and oriented x3 mood and affect normal  5) Skin no lacerations  or ulcerations no nodularity no palpable masses, no erythema or nodularity  6) Musculoskeletal:  Tenderness in C-spine right trapezius muscle  Right shoulder tenderness posterior subacromial space anterior rotator interval.  Pain reaching behind or overhead pain at 150 degrees of flexion positive impingement sign no instability    MEDICAL DECISION SECTION:  Encounter Diagnoses  Name Primary?  . Acute pain of right shoulder Yes  . Rotator cuff tendinitis, right   . Cervical paraspinal muscle spasm     Imaging X-rays were done at the hospital.  X-ray shows no fracture dislocation  Report is read into the record CLINICAL DATA:  Lifting injury   EXAM: RIGHT SHOULDER - 2+ VIEW   COMPARISON:  None.   FINDINGS: There is no evidence of fracture or dislocation. There is no evidence of arthropathy or other focal bone abnormality. Soft tissues are unremarkable.   IMPRESSION: Negative.     Electronically Signed   By: Ulyses Jarred M.D.   On: 05/02/2019 22:05   Plan:  (Rx., Inj., surg., Frx, MRI/CT, XR:2)   Procedure note the subacromial injection shoulder RIGHT    Verbal consent was obtained to inject the  RIGHT   Shoulder  Timeout was completed to confirm the injection site is a subacromial space of the  RIGHT  shoulder   Medication used Depo-Medrol 40 mg and lidocaine 1% 3 cc  Anesthesia was provided by ethyl chloride  The injection was performed in the RIGHT  posterior subacromial space. After pinning the skin with alcohol and anesthetized the skin with ethyl chloride the subacromial space was injected using a 20-gauge needle. There were no complications  Sterile dressing was applied.  Meds ordered this encounter  Medications  . cyclobenzaprine (FLEXERIL) 5 MG tablet    Sig: Take 1 tablet (5 mg total) by mouth 3 (three) times daily as needed for muscle spasms.    Dispense:  30 tablet    Refill:  0  . predniSONE (DELTASONE) 10 MG tablet    Sig: Take 1  tablet (10 mg total) by mouth daily.    Dispense:  60 tablet    Refill:  0  . acetaminophen-codeine (TYLENOL #3) 300-30 MG tablet    Sig: Take 1 tablet by mouth every 6 (six) hours as needed for moderate pain.    Dispense:  30 tablet    Refill:  0     11:26 AM Fuller CanadaStanley Harrison, MD  07/11/2019

## 2019-08-15 ENCOUNTER — Other Ambulatory Visit: Payer: Self-pay | Admitting: Orthopedic Surgery

## 2019-08-15 DIAGNOSIS — M25511 Pain in right shoulder: Secondary | ICD-10-CM

## 2019-10-25 ENCOUNTER — Other Ambulatory Visit: Payer: Self-pay | Admitting: Orthopedic Surgery

## 2019-10-25 DIAGNOSIS — M7581 Other shoulder lesions, right shoulder: Secondary | ICD-10-CM

## 2019-10-25 DIAGNOSIS — M25511 Pain in right shoulder: Secondary | ICD-10-CM

## 2019-10-25 DIAGNOSIS — M62838 Other muscle spasm: Secondary | ICD-10-CM

## 2020-04-10 ENCOUNTER — Emergency Department (HOSPITAL_COMMUNITY): Payer: Self-pay

## 2020-04-10 ENCOUNTER — Emergency Department (HOSPITAL_COMMUNITY)
Admission: EM | Admit: 2020-04-10 | Discharge: 2020-04-10 | Disposition: A | Discharge status: Home / Self Care | Payer: Self-pay | Attending: Emergency Medicine | Admitting: Emergency Medicine

## 2020-04-10 ENCOUNTER — Encounter (HOSPITAL_COMMUNITY): Payer: Self-pay | Admitting: Emergency Medicine

## 2020-04-10 ENCOUNTER — Other Ambulatory Visit: Payer: Self-pay

## 2020-04-10 DIAGNOSIS — Z87891 Personal history of nicotine dependence: Secondary | ICD-10-CM | POA: Insufficient documentation

## 2020-04-10 DIAGNOSIS — Y939 Activity, unspecified: Secondary | ICD-10-CM | POA: Insufficient documentation

## 2020-04-10 DIAGNOSIS — S60022A Contusion of left index finger without damage to nail, initial encounter: Secondary | ICD-10-CM | POA: Insufficient documentation

## 2020-04-10 DIAGNOSIS — Y999 Unspecified external cause status: Secondary | ICD-10-CM | POA: Insufficient documentation

## 2020-04-10 DIAGNOSIS — Y929 Unspecified place or not applicable: Secondary | ICD-10-CM | POA: Insufficient documentation

## 2020-04-10 DIAGNOSIS — Z79899 Other long term (current) drug therapy: Secondary | ICD-10-CM | POA: Insufficient documentation

## 2020-04-10 DIAGNOSIS — W231XXA Caught, crushed, jammed, or pinched between stationary objects, initial encounter: Secondary | ICD-10-CM | POA: Insufficient documentation

## 2020-04-10 DIAGNOSIS — M25511 Pain in right shoulder: Secondary | ICD-10-CM | POA: Insufficient documentation

## 2020-04-10 MED ORDER — ACETAMINOPHEN 325 MG PO TABS
650.0000 mg | ORAL_TABLET | Freq: Once | ORAL | Status: AC
  Administered 2020-04-10: 07:00:00 650 mg via ORAL
  Filled 2020-04-10: qty 2

## 2020-04-10 NOTE — Discharge Instructions (Addendum)
We are the finger splint or buddy tape the finger as needed.  Apply ice for 30 minutes at a time, 4 times a day.  Take acetaminophen and/or ibuprofen as needed for pain.  Taking both of them gives you more pain relief and taking either one by itself.  Follow-up with your orthopedic doctor.

## 2020-04-10 NOTE — ED Provider Notes (Signed)
[facility] EMERGENCY DEPARTMENT Provider Note   CSN: [id number] Arrival date & time: 04/10/20  0449   History Chief Complaint  Patient presents with  . Finger Injury    Kara Scott is a 29 y.o. female.  The history is provided by the patient.  She has history of lupus and comes in having injured her left second finger when she accidentally closed the car door on it.  She is complaining of severe pain in that finger to the point where she was unable to sleep.  She denies other injury.  As a second complaint, she has been having pain in the right rotator cuff and had had a steroid injection.  The steroid injection has worn off and she is having increasing pain radiating to the suprascapular area and into the neck.  She denies any numbness or tingling.  She denies any trauma.  She did take a dose of ibuprofen at home without any relief.  Past Medical History:  Diagnosis Date  . Lupus ([facility])     There are no problems to display for this patient.   History reviewed. No pertinent surgical history.   OB History   No obstetric history on file.     Family History  Problem Relation Age of Onset  . Healthy Mother   . Healthy Father   . Diabetes Maternal Grandmother     Social History   Tobacco Use  . Smoking status: Former Smoker    Packs/day: 0.00    Years: 5.00    Pack years: 0.00    Types: Cigarettes  . Smokeless tobacco: Never Used  Substance Use Topics  . Alcohol use: No  . Drug use: No    Home Medications Prior to Admission medications   Medication Sig Start Date End Date Taking? Authorizing Provider  acetaminophen-codeine (TYLENOL #3) 300-30 MG tablet TAKE 1 TABLET BY MOUTH EVERY 6 HOURS AS NEEDED FOR MODERATE PAIN 10/28/19   Vickki Hearing, MD  cyclobenzaprine (FLEXERIL) 5 MG tablet Take 1 tablet (5 mg total) by mouth 3 (three) times daily as needed for muscle spasms. 07/11/19   Vickki Hearing, MD  ibuprofen (ADVIL) 800 MG tablet Take 1 tablet (800 mg  total) by mouth 3 (three) times daily. Take with food 05/02/19   Triplett, Tammy, PA-C  predniSONE (DELTASONE) 10 MG tablet TAKE 1 TABLET BY MOUTH DAILY 10/28/19   Vickki Hearing, MD    Allergies    Patient has no known allergies.  Review of Systems   Review of Systems  All other systems reviewed and are negative.   Physical Exam Updated Vital Signs BP (!) 145/76   Pulse 61   Temp 98.4 F (36.9 C) (Oral)   Resp 18   Ht 5\' 5"  (1.651 m)   Wt 56.7 kg   LMP 04/02/2020   SpO2 100%   BMI 20.80 kg/m   Physical Exam Vitals and nursing note reviewed.   29 year old female, resting comfortably and in no acute distress. Vital signs are significant for borderline elevated blood pressure. Oxygen saturation is 100%, which is normal. Head is normocephalic and atraumatic. PERRLA, EOMI. Oropharynx is clear. Neck is nontender and supple without adenopathy or JVD. Back has moderate tenderness going across the right suprascapular area and into the right shoulder.  There is no CVA tenderness. Lungs are clear without rales, wheezes, or rhonchi. Chest is nontender. Heart has regular rate and rhythm without murmur. Abdomen is soft, flat, nontender without masses  or hepatosplenomegaly and peristalsis is normoactive. Extremities: Mild swelling and moderate tenderness noted middle phalanx of the left second finger.  No deformity seen.  There is tenderness rather diffusely throughout the right shoulder but full passive range of motion is present. Skin is warm and dry without rash. Neurologic: Mental status is normal, cranial nerves are intact, there are no motor or sensory deficits.  ED Results / Procedures / Treatments    Radiology DG Finger Index Left  Result Date: 04/10/2020 CLINICAL DATA:  Posttraumatic index finger pain EXAM: LEFT INDEX FINGER 2+V COMPARISON:  None. FINDINGS: There is no evidence of fracture or dislocation. There is no evidence of arthropathy or other focal bone abnormality.  Soft tissues are unremarkable. IMPRESSION: Negative. Electronically Signed   By: Monte Fantasia M.D.   On: 04/10/2020 06:20    Procedures Procedures   Medications Ordered in ED Medications  acetaminophen (TYLENOL) tablet 650 mg (has no administration in time range)    ED Course  I have reviewed the triage vital signs and the nursing notes.  Pertinent imaging results that were available during my care of the patient were reviewed by me and considered in my medical decision making (see chart for details).  MDM Rules/Calculators/A&P Injury to left second finger, concern for fracture.  Exacerbation of subacute to chronic right shoulder pain.  She is sent for x-rays of her finger.  Recommended that she follow-up with her orthopedic physician regarding her ongoing right shoulder pain.  Old records are reviewed, and her evaluation for right shoulder pain and steroid injection was on July 11, 2019.  X-rays show no evidence of finger fracture.  Finger is immobilized with a finger splint and patient is advised to wear the splint or buddy tape the finger as needed.  Encouraged ice and elevation, over-the-counter analgesics as needed for pain.  Follow-up with her orthopedic physician for both her shoulder complaints and her finger.  Final Clinical Impression(s) / ED Diagnoses Final diagnoses:  Contusion of left index finger without damage to nail, initial encounter  Pain of right shoulder region    Rx / DC Orders ED Discharge Orders    None       Delora Fuel, MD 88/89/16 [phone]

## 2020-04-10 NOTE — ED Triage Notes (Signed)
Pt slammed L index finger in car door yesterday. Presents here today for pain and swelling in index finger. Pt also wants her R rotator cuff checked for increased pain that radiates into neck.

## 2020-05-02 ENCOUNTER — Ambulatory Visit: Payer: Self-pay | Admitting: Orthopedic Surgery

## 2020-05-19 ENCOUNTER — Encounter: Payer: Self-pay | Admitting: Orthopedic Surgery

## 2020-05-19 ENCOUNTER — Ambulatory Visit: Payer: Self-pay | Admitting: Orthopedic Surgery

## 2020-05-29 ENCOUNTER — Emergency Department (HOSPITAL_COMMUNITY): Admission: EM | Admit: 2020-05-29 | Discharge: 2020-05-29 | Discharge status: Against Medical Advice | Payer: Self-pay

## 2020-05-29 ENCOUNTER — Other Ambulatory Visit: Payer: Self-pay

## 2022-01-09 IMAGING — DX DG FINGER INDEX 2+V*L*
3 series · 3 of 3 positions shown · non-contrast
Comparison: None.

CLINICAL DATA: Posttraumatic index finger pain

EXAM:
LEFT INDEX FINGER 2+V

[finger ap]
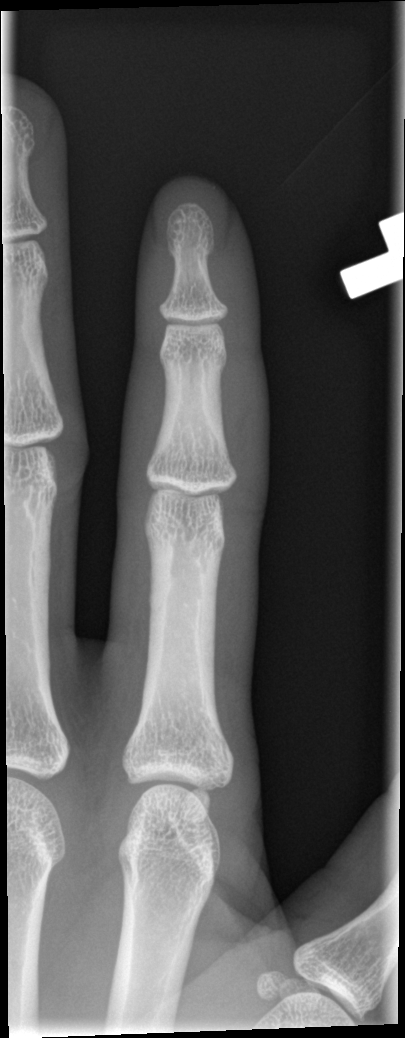

[finger obl]
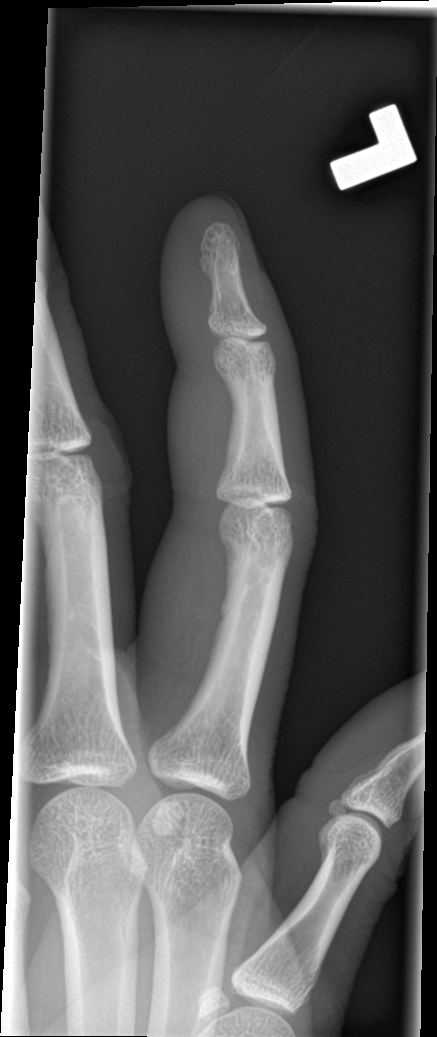

[finger lat]
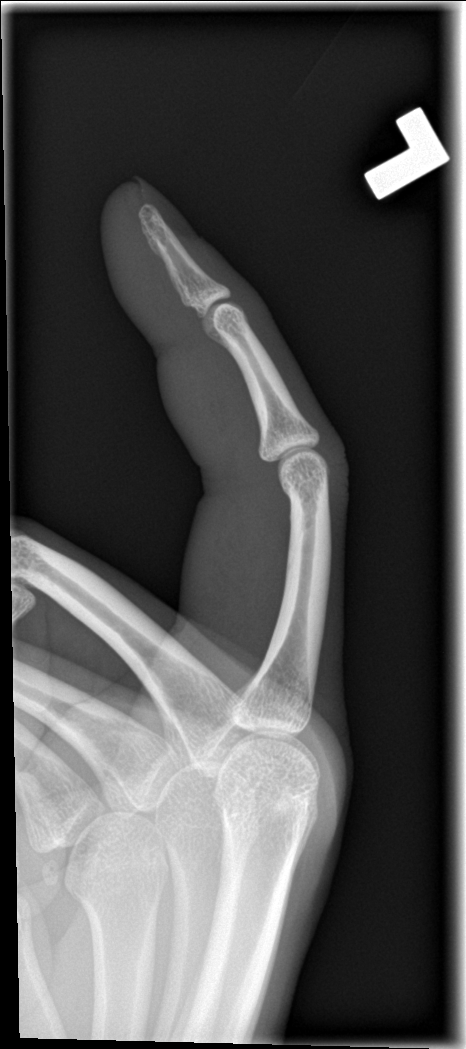

[3 of 3 positions shown; findings below may reference images not displayed]

FINDINGS: There is no evidence of fracture or dislocation. There is no
evidence of arthropathy or other focal bone abnormality. Soft
tissues are unremarkable.
IMPRESSION: Negative.

## 2023-02-12 ENCOUNTER — Other Ambulatory Visit: Payer: Self-pay

## 2023-02-12 ENCOUNTER — Emergency Department (HOSPITAL_COMMUNITY): Payer: Self-pay

## 2023-02-12 ENCOUNTER — Encounter (HOSPITAL_COMMUNITY): Payer: Self-pay

## 2023-02-12 ENCOUNTER — Emergency Department (HOSPITAL_COMMUNITY)
Admission: EM | Admit: 2023-02-12 | Discharge: 2023-02-12 | Disposition: A | Discharge status: Home / Self Care | Payer: Self-pay | Attending: Emergency Medicine | Admitting: Emergency Medicine

## 2023-02-12 DIAGNOSIS — G8929 Other chronic pain: Secondary | ICD-10-CM | POA: Diagnosis present

## 2023-02-12 DIAGNOSIS — Z1152 Encounter for screening for COVID-19: Secondary | ICD-10-CM | POA: Insufficient documentation

## 2023-02-12 DIAGNOSIS — R0789 Other chest pain: Secondary | ICD-10-CM | POA: Insufficient documentation

## 2023-02-12 DIAGNOSIS — J205 Acute bronchitis due to respiratory syncytial virus: Secondary | ICD-10-CM | POA: Insufficient documentation

## 2023-02-12 HISTORY — DX: Pain, unspecified: R52

## 2023-02-12 HISTORY — DX: Other chronic pain: G89.29

## 2023-02-12 LAB — BASIC METABOLIC PANEL
Anion gap: 7 (ref 5–15)
BUN: 14 mg/dL (ref 6–20)
CO2: 25 mmol/L (ref 22–32)
Calcium: 8.6 mg/dL — ABNORMAL LOW (ref 8.9–10.3)
Chloride: 105 mmol/L (ref 98–111)
Creatinine, Ser: 0.76 mg/dL (ref 0.44–1.00)
GFR, Estimated: >60 mL/min (ref >60)
Glucose, Bld: 90 mg/dL (ref 70–99)
Potassium: 3.7 mmol/L (ref 3.5–5.1)
Sodium: 137 mmol/L (ref 135–145)

## 2023-02-12 LAB — CBC WITH DIFFERENTIAL/PLATELET
Abs Immature Granulocytes: 0.01 10*3/uL (ref 0.00–0.07)
Basophils Absolute: 0 10*3/uL (ref 0.0–0.1)
Basophils Relative: 1 %
Eosinophils Absolute: 0.6 10*3/uL — ABNORMAL HIGH (ref 0.0–0.5)
Eosinophils Relative: 9 %
HCT: 35.8 % — ABNORMAL LOW (ref 36.0–46.0)
Hemoglobin: 11.8 g/dL — ABNORMAL LOW (ref 12.0–15.0)
Immature Granulocytes: 0 %
Lymphocytes Relative: 35 %
Lymphs Abs: 2.3 10*3/uL (ref 0.7–4.0)
MCH: 27.2 pg (ref 26.0–34.0)
MCHC: 33 g/dL (ref 30.0–36.0)
MCV: 82.5 fL (ref 80.0–100.0)
Monocytes Absolute: 0.6 10*3/uL (ref 0.1–1.0)
Monocytes Relative: 9 %
Neutro Abs: 3 10*3/uL (ref 1.7–7.7)
Neutrophils Relative %: 46 %
Platelets: 243 10*3/uL (ref 150–400)
RBC: 4.34 MIL/uL (ref 3.87–5.11)
RDW: 12 % (ref 11.5–15.5)
WBC: 6.6 10*3/uL (ref 4.0–10.5)
nRBC: 0 % (ref 0.0–0.2)

## 2023-02-12 LAB — RESP PANEL BY RT-PCR (RSV, FLU A&B, COVID)  RVPGX2
Influenza A by PCR: NEGATIVE
Influenza B by PCR: NEGATIVE
Resp Syncytial Virus by PCR: POSITIVE — AB
SARS Coronavirus 2 by RT PCR: NEGATIVE

## 2023-02-12 LAB — D-DIMER, QUANTITATIVE: D-Dimer, Quant: 0.31 ug/mL-FEU (ref 0.00–0.50)

## 2023-02-12 MED ORDER — KETOROLAC TROMETHAMINE 30 MG/ML IJ SOLN
30.0000 mg | Freq: Once | INTRAMUSCULAR | Status: DC
  Filled 2023-02-12: qty 1

## 2023-02-12 MED ORDER — KETOROLAC TROMETHAMINE 30 MG/ML IJ SOLN: 30.0000 mg | Freq: Once | INTRAMUSCULAR | Status: DC

## 2023-02-12 NOTE — ED Provider Notes (Signed)
Kara Scott  Provider Note  CSN: [id number] Arrival date & time: 02/12/23 [id number]  History Chief Complaint  Patient presents with   Chest Pain    Kara Scott is a 32 y.o. female with reported history of lupus here for evaluation of cough and R upper chest pain, radiating into her back. Reports she had a mild cough earlier in the week but was woken up with this pain a short time prior to arrival. Denies any fever. When I attempted to elicit more details about her lupus history including symptoms, organs affected, medications used etc, she became argumentative and defensive telling that all of the information should be in her chart including a visit with Dr. Aline Brochure in 2020. Review of that visit for shoulder pain does not mention any diagnostic testing for Lupus. She reports this may have been done in [place]. She is only on pain medications now (buprenorphine) and NSAIDs. She is not on any steroids or other biologics. She does not have a PCP.  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 All prior labs available in Epic are from 2 visits in 2018 and were normal then.    Home Medications Prior to Admission medications   Medication Sig Start Date End Date Taking? Authorizing Provider  Buprenorphine HCl-Naloxone HCl 8-2 MG FILM Take by mouth 2 (two) times daily.    [provider]  ibuprofen (ADVIL) 800 MG tablet Take 1 tablet (800 mg total) by mouth 3 (three) times daily. Take with food 05/02/19   Triplett, Tammy, PA-C     Allergies    Patient has no known allergies.   Review of Systems   Review of Systems Please see HPI for pertinent positives and negatives  Physical Exam BP 122/85   Pulse 64   Temp 97.9 F (36.6 C) (Oral)   Resp 17   Ht '5\' 4"'$  (1.626 m)   Wt 61.2 kg   SpO2 100%   BMI 23.17 kg/m   Physical Exam Vitals and nursing note reviewed.   Constitutional:      Appearance: Normal appearance.  HENT:     Head: Normocephalic and atraumatic.     Nose: Nose normal.     Mouth/Throat:     Mouth: Mucous membranes are moist.  Eyes:     Extraocular Movements: Extraocular movements intact.     Conjunctiva/sclera: Conjunctivae normal.  Cardiovascular:     Rate and Rhythm: Normal rate.  Pulmonary:     Effort: Pulmonary effort is normal.     Breath sounds: Rhonchi present. No wheezing.  Chest:     Chest wall: Tenderness (R upper chest) present.  Abdominal:     General: Abdomen is flat.     Palpations: Abdomen is soft.     Tenderness: There is no abdominal tenderness.  Musculoskeletal:        General: No swelling. Normal range of motion.     Cervical back: Neck supple.  Skin:    General: Skin is warm and dry.  Neurological:     General: No focal deficit present.     Mental Status: She is alert.  Psychiatric:        Mood and Affect: Mood normal.     ED Results / Procedures / Treatments   EKG EKG Interpretation  Date/Time:  Saturday February 12 2023 05:23:00 EST Ventricular Rate:  69 PR Interval:  177 QRS Duration: 82 QT Interval:  388 QTC Calculation: 416 R Axis:   50 Text Interpretation: Sinus rhythm Normal ECG No significant change since last tracing Confirmed by Calvert Cantor [phone]) on 02/12/2023 5:28:05 AM  Procedures Procedures  Medications Ordered in the ED Medications  ketorolac (TORADOL) 30 MG/ML injection 30 mg (has no administration in time range)    Initial Impression and Plan  Patient here with pleuritic R upper chest pain. Has a raspy cough. Vitals are reassuring. Given reported history of lupus will check labs including dimer and a CXR.   ED Course   Clinical Course as of 02/12/23 0657  Sat Feb 12, 2023  0601 I personally viewed the images from radiology studies and agree with radiologist interpretation: CXR is clear  [CS]  0622 CBC with mild anemia. No recent for comparison. Covid/Flu/RSV  swab is positive for RSV, likely the cause of her cough.  [CS]  [id number] BMP is normal. Will give Toradol for pain.  [CS]  0654 Dimer is normal. Patient remains well appearing in no distress. Plan discharge with OTC symptomatic treatment for RSV and MSK chest pain.  [CS]    Clinical Course User Index [CS] Truddie Hidden, MD     MDM Rules/Calculators/A&P Medical Decision Making Given presenting complaint, I considered that admission might be necessary. After review of results from ED lab and/or imaging studies, admission to the hospital is not indicated at this time.    Problems Addressed: Chest wall pain: acute illness or injury RSV bronchitis: acute illness or injury  Amount and/or Complexity of Data Reviewed Labs: ordered. Decision-making details documented in ED Course. Radiology: ordered and independent interpretation performed. Decision-making details documented in ED Course. ECG/medicine tests: ordered and independent interpretation performed. Decision-making details documented in ED Course.  Risk Prescription drug management. Decision regarding hospitalization.     Final Clinical Impression(s) / ED Diagnoses Final diagnoses:  RSV bronchitis  Chest wall pain    Rx / DC Orders ED Discharge Orders     None        Truddie Hidden, MD 02/12/23 [phone]

## 2023-02-12 NOTE — ED Triage Notes (Signed)
Pt to Ed with c/o chest pain that started just before arrival, pt says pain occurs only when she coughs, pt reports cough x 1 week.

## 2023-05-17 ENCOUNTER — Ambulatory Visit: Payer: Medicaid Other | Admitting: Nurse Practitioner

## 2023-05-20 DIAGNOSIS — M329 Systemic lupus erythematosus, unspecified: Secondary | ICD-10-CM | POA: Insufficient documentation

## 2023-05-20 DIAGNOSIS — Z716 Tobacco abuse counseling: Secondary | ICD-10-CM | POA: Diagnosis not present

## 2023-05-20 DIAGNOSIS — L930 Discoid lupus erythematosus: Secondary | ICD-10-CM | POA: Diagnosis not present

## 2023-05-30 NOTE — Progress Notes (Unsigned)
Subjective: WJ:XBJYNWGNF care, *** HPI: Kara Scott is a 32 y.o. female presenting to clinic today for:  1. ***  Past Medical History:  Diagnosis Date   Chronic pain    Lupus (HCC)    Pain management    No past surgical history on file. Social History   Socioeconomic History   Marital status: Single    Spouse name: Not on file   Number of children: Not on file   Years of education: Not on file   Highest education level: Not on file  Occupational History   Not on file  Tobacco Use   Smoking status: Never   Smokeless tobacco: Never  Vaping Use   Vaping Use: Never used  Substance and Sexual Activity   Alcohol use: No   Drug use: No   Sexual activity: Yes    Birth control/protection: None  Other Topics Concern   Not on file  Social History Narrative   Not on file   Social Determinants of Health   Financial Resource Strain: Not on file  Food Insecurity: Not on file  Transportation Needs: Not on file  Physical Activity: Not on file  Stress: Not on file  Social Connections: Not on file  Intimate Partner Violence: Not on file   No outpatient medications have been marked as taking for the 05/31/23 encounter (Appointment) with [name], NP.   Family History  Problem Relation Age of Onset   Healthy Mother    Healthy Father    Diabetes Maternal Grandmother    No Known Allergies   Health Maintenance: ***  Flu Vaccine: {YES/NO/WILD AOZHY:86578}  Tdap Vaccine: {YES/NO/WILD IONGE:95284}  - every 17yrs - (<3 lifetime doses or unknown): all wounds -- look up need for Tetanus IG - (>=3 lifetime doses): clean/minor wound if >45yrs from previous; all other wounds if >10yrs from previous Zoster Vaccine: {YES/NO/WILD CARDS:18581} (those >50yo, once) Pneumonia Vaccine: {YES/NO/WILD XLKGM:01027} (those w/ risk factors) - (<75yr) Both: Immunocompromised, cochlear implant, CSF leak, asplenic, sickle cell, Chronic Renal Failure - (<19yr) PPSV-23 only:  Heart dz, lung disease, DM, tobacco abuse, alcoholism, cirrhosis/liver disease. - (>79yr): PPSV13 then PPSV23 in 6-12mths;  - (>57yr): repeat PPSV23 once if pt received prior to 34yo and 36yrs have passed   {ROS General:210120200}  Objective: Office vital signs reviewed. There were no vitals taken for this visit.  Physical Examination:  General: Awake, alert, *** nourished, No acute distress HEENT: Normal    Neck: No masses palpated. No lymphadenopathy    Ears: Tympanic membranes intact, normal light reflex, no erythema, no bulging    Eyes: PERRLA, extraocular movement in tact, sclera ***    Nose: nasal turbinates moist, *** nasal discharge    Throat: moist mucus membranes, no erythema, *** tonsillar exudate.  Airway is patent Cardio: regular rate and rhythm, S1S2 heard, no murmurs appreciated Pulm: clear to auscultation bilaterally, no wheezes, rhonchi or rales; normal work of breathing on room air GI: soft, non-tender, non-distended, bowel sounds present x4, no hepatomegaly, no splenomegaly, no masses GU: external vaginal tissue ***, cervix ***, *** punctate lesions on cervix appreciated, *** discharge from cervical os, *** bleeding, *** cervical motion tenderness, *** abdominal/ adnexal masses Extremities: warm, well perfused, No edema, cyanosis or clubbing; +*** pulses bilaterally MSK: *** gait and *** station Skin: dry; intact; no rashes or lesions Neuro: *** Strength and light touch sensation grossly intact, *** DTRs ***/4  Assessment/ Plan: 32 y.o. female   The above assessment and management  plan was discussed with the patient. The patient verbalized understanding of and has agreed to the management plan. Patient is aware to call the clinic if they develop any new symptoms or if symptoms persist or worsen. Patient is aware when to return to the clinic for a follow-up visit. Patient educated on when it is appropriate to go to the emergency department.    No problem-specific  Assessment & Plan notes found for this encounter.   Arrie Aran Santa Lighter, DNP [facility] [address] [place], [place] [address] [phone]

## 2023-05-31 ENCOUNTER — Encounter: Payer: Self-pay | Admitting: Nurse Practitioner

## 2023-05-31 ENCOUNTER — Telehealth: Payer: Self-pay

## 2023-05-31 ENCOUNTER — Ambulatory Visit (INDEPENDENT_AMBULATORY_CARE_PROVIDER_SITE_OTHER): Payer: Medicaid Other | Admitting: Nurse Practitioner

## 2023-05-31 VITALS — BP 129/64 | HR 96 | Temp 100.0°F | Ht 64.0 in | Wt 120.6 lb

## 2023-05-31 DIAGNOSIS — Z0000 Encounter for general adult medical examination without abnormal findings: Secondary | ICD-10-CM | POA: Diagnosis not present

## 2023-05-31 DIAGNOSIS — D508 Other iron deficiency anemias: Secondary | ICD-10-CM | POA: Diagnosis not present

## 2023-05-31 DIAGNOSIS — Z23 Encounter for immunization: Secondary | ICD-10-CM | POA: Diagnosis not present

## 2023-05-31 DIAGNOSIS — F419 Anxiety disorder, unspecified: Secondary | ICD-10-CM | POA: Diagnosis not present

## 2023-05-31 DIAGNOSIS — R7309 Other abnormal glucose: Secondary | ICD-10-CM | POA: Diagnosis not present

## 2023-05-31 DIAGNOSIS — F32A Depression, unspecified: Secondary | ICD-10-CM | POA: Diagnosis not present

## 2023-05-31 DIAGNOSIS — Z5941 Food insecurity: Secondary | ICD-10-CM

## 2023-05-31 DIAGNOSIS — Z7689 Persons encountering health services in other specified circumstances: Secondary | ICD-10-CM | POA: Diagnosis not present

## 2023-05-31 DIAGNOSIS — J302 Other seasonal allergic rhinitis: Secondary | ICD-10-CM | POA: Diagnosis not present

## 2023-05-31 DIAGNOSIS — Z136 Encounter for screening for cardiovascular disorders: Secondary | ICD-10-CM | POA: Diagnosis not present

## 2023-05-31 DIAGNOSIS — M329 Systemic lupus erythematosus, unspecified: Secondary | ICD-10-CM

## 2023-05-31 LAB — BAYER DCA HB A1C WAIVED: HB A1C (BAYER DCA - WAIVED): 5 % (ref 4.8–5.6)

## 2023-05-31 LAB — PREGNANCY, URINE: Preg Test, Ur: NEGATIVE

## 2023-05-31 MED ORDER — ESCITALOPRAM OXALATE 10 MG PO TABS: 10.0000 mg | ORAL_TABLET | Freq: Every day | ORAL | 0 refills | Status: DC

## 2023-05-31 MED ORDER — CETIRIZINE HCL 10 MG PO TABS: 10.0000 mg | ORAL_TABLET | Freq: Every day | ORAL | 1 refills | Status: DC

## 2023-05-31 NOTE — Progress Notes (Signed)
Error

## 2023-06-01 LAB — THYROID PANEL WITH TSH
Free Thyroxine Index: 1.6 (ref 1.2–4.9)
T3 Uptake Ratio: 26 % (ref 24–39)
T4, Total: 6.1 ug/dL (ref 4.5–12.0)
TSH: 1.99 u[IU]/mL (ref 0.450–4.500)

## 2023-06-01 LAB — CMP14+EGFR
ALT: 18 IU/L (ref 0–32)
AST: 25 IU/L (ref 0–40)
Albumin: 4.6 g/dL (ref 3.9–4.9)
Alkaline Phosphatase: 63 IU/L (ref 44–121)
BUN/Creatinine Ratio: 26 — ABNORMAL HIGH (ref 9–23)
BUN: 16 mg/dL (ref 6–20)
Bilirubin Total: <0.2 mg/dL (ref 0.0–1.2)
CO2: 21 mmol/L (ref 20–29)
Calcium: 9.1 mg/dL (ref 8.7–10.2)
Chloride: 103 mmol/L (ref 96–106)
Creatinine, Ser: 0.62 mg/dL (ref 0.57–1.00)
Globulin, Total: 2.5 g/dL (ref 1.5–4.5)
Glucose: 78 mg/dL (ref 70–99)
Potassium: 4 mmol/L (ref 3.5–5.2)
Sodium: 137 mmol/L (ref 134–144)
Total Protein: 7.1 g/dL (ref 6.0–8.5)
eGFR: 120 mL/min/{1.73_m2} (ref >59)

## 2023-06-01 LAB — ANEMIA PROFILE B
Basophils Absolute: 0 10*3/uL (ref 0.0–0.2)
Basos: 0 %
EOS (ABSOLUTE): 0.3 10*3/uL (ref 0.0–0.4)
Eos: 3 %
Ferritin: 98 ng/mL (ref 15–150)
Folate: 13.7 ng/mL (ref >3.0)
Hematocrit: 34.9 % (ref 34.0–46.6)
Hemoglobin: 11.3 g/dL (ref 11.1–15.9)
Immature Grans (Abs): 0 10*3/uL (ref 0.0–0.1)
Immature Granulocytes: 0 %
Iron Saturation: 14 % — ABNORMAL LOW (ref 15–55)
Iron: 43 ug/dL (ref 27–159)
Lymphocytes Absolute: 2.9 10*3/uL (ref 0.7–3.1)
Lymphs: 31 %
MCH: 26.7 pg (ref 26.6–33.0)
MCHC: 32.4 g/dL (ref 31.5–35.7)
MCV: 83 fL (ref 79–97)
Monocytes Absolute: 0.6 10*3/uL (ref 0.1–0.9)
Monocytes: 7 %
Neutrophils Absolute: 5.6 10*3/uL (ref 1.4–7.0)
Neutrophils: 59 %
Platelets: 337 10*3/uL (ref 150–450)
RBC: 4.23 x10E6/uL (ref 3.77–5.28)
RDW: 13.4 % (ref 11.7–15.4)
Retic Ct Pct: 1.4 % (ref 0.6–2.6)
Total Iron Binding Capacity: 299 ug/dL (ref 250–450)
UIBC: 256 ug/dL (ref 131–425)
Vitamin B-12: >2000 pg/mL — ABNORMAL HIGH (ref 232–1245)
WBC: 9.4 10*3/uL (ref 3.4–10.8)

## 2023-06-01 LAB — LIPID PANEL
Chol/HDL Ratio: 2.9 ratio (ref 0.0–4.4)
Cholesterol, Total: 161 mg/dL (ref 100–199)
HDL: 55 mg/dL (ref >39)
LDL Chol Calc (NIH): 87 mg/dL (ref 0–99)
Triglycerides: 103 mg/dL (ref 0–149)
VLDL Cholesterol Cal: 19 mg/dL (ref 5–40)

## 2023-06-01 NOTE — Progress Notes (Signed)
Anemia profile is abnormal B12 level is high and iron saturation is slight low, will continue monitor B12 level  All other labs are normal

## 2023-06-06 ENCOUNTER — Encounter: Payer: Self-pay | Admitting: *Deleted

## 2023-06-06 ENCOUNTER — Other Ambulatory Visit: Payer: Medicaid Other | Admitting: *Deleted

## 2023-06-06 NOTE — Patient Outreach (Signed)
Medicaid Managed Care   Nurse Care Manager Note  06/06/2023 Name:  Kara Scott MRN:  [id number] DOB:  01/14/1991  Kara Scott is an 32 y.o. year old female who is a primary patient of St Vena Austria, NP.  The [facility] Managed Care Coordination team was consulted for assistance with:    Chronic Pain and Health Maintenance  Ms. Kara Scott was given information about Medicaid Managed Care Coordination team services today. Kara Scott Patient agreed to services and verbal consent obtained.  Engaged with patient by telephone for initial visit in response to provider referral for case management and/or care coordination services.   Assessments/Interventions:  Review of past medical history, allergies, medications, health status, including review of consultants reports, laboratory and other test data, was performed as part of comprehensive evaluation and provision of chronic care management services.  SDOH (Social Determinants of Health) assessments and interventions performed: SDOH Interventions    Flowsheet Row Patient Outreach Telephone from 06/06/2023 in [place] POPULATION HEALTH DEPARTMENT Office Visit from 05/31/2023 in [facility]  SDOH Interventions    Food Insecurity Interventions Intervention Not Indicated  [Now receiving food stamps] --  Housing Interventions Intervention Not Indicated --  Transportation Interventions Intervention Not Indicated --  Depression Interventions/Treatment  -- Medication, Counseling       Care Plan  No Known Allergies  Medications Reviewed Today     Reviewed by Heidi Dach, RN (Registered Nurse) on 06/06/23 at 1604  Med List Status: <None>   Medication Order Taking? Sig Documenting Provider Last Dose Status Informant  Buprenorphine HCl-Naloxone HCl 8-2 MG FILM [id number] Yes Take by mouth 2 (two) times daily. [provider] Taking Active   cetirizine (ZYRTEC) 10 MG tablet [id number] Yes Take 1  tablet (10 mg total) by mouth daily. St Santa Lighter, Dois Davenport, NP Taking Active   escitalopram (LEXAPRO) 10 MG tablet [id number] No Take 1 tablet (10 mg total) by mouth daily.  Patient not taking: Reported on 06/06/2023   Kara Sinner, NP Not Taking Active   ibuprofen (ADVIL) 800 MG tablet [id number] Yes Take 800 mg by mouth every 8 (eight) hours as needed. [provider] Taking Active   methylPREDNISolone (MEDROL) 4 MG tablet [id number] No Take 4 mg by mouth daily.  Patient not taking: Reported on 06/06/2023   [provider] Not Taking Active            Med Note [name] Jun 06, 2023  4:04 PM) Completed            Patient Active Problem List   Diagnosis Date Noted   Encounter to establish care 05/31/2023   Routine general medical examination at health care facility 05/31/2023   Anxiety and depression 05/31/2023   Iron deficiency anemia secondary to inadequate dietary iron intake 05/31/2023   Need for Tdap vaccination 05/31/2023   Seasonal allergic rhinitis 05/31/2023   Moderate food insecurity 05/31/2023   Lupus (HCC) 05/20/2023   Tobacco abuse counseling 05/20/2023   Chronic pain 02/12/2023    Conditions to be addressed/monitored per PCP order:   Chronic Pain and Health Maintenance  Care Plan : RN Care Manager Plan of Care  Updates made by Heidi Dach, RN since 06/06/2023 12:00 AM     Problem: Health Management needs related to Health Maintenance and Chronic Pain      Long-Range Goal: Development of Plan of Care to address Health Management needs related  to Health Maintenance and Chronic Pain   Start Date: 06/06/2023  Expected End Date: 09/04/2023  Note:   Current Barriers:  Chronic Disease Management support and education needs related to Chronic Pain and Health Maintenance  RNCM Clinical Goal(s):  Patient will verbalize understanding of plan for management of Chronic Pain and Health Maintenance as evidenced by patient  reports attend all scheduled medical appointments: 08/31/23 with PCP and Rheumatology 10/24/23 as evidenced by provider documentation        continue to work with RN Care Manager and/or Social Worker to address care management and care coordination needs related to Chronic Pain and Health Maintenance as evidenced by adherence to CM Team Scheduled appointments     through collaboration with Medical illustrator, provider, and care team.   Interventions: Inter-disciplinary care team collaboration (see longitudinal plan of care) Evaluation of current treatment plan related to  self management and patient's adherence to plan as established by provider   Health Maintenance Interventions:  (Status:  New goal.) Long Term Goal Patient interviewed about adult health maintenance status including  Regular eye checkups Regular Dental Care    Provided education about Health Maintenance for Women Discussed the benefits of exercise, advised 30 minutes of exercise for 5 days a week Provided education on Anemia, discussed increasing iron rich foods in her diet Advised patient to contact [facility] Member Services [phone] for member benefits    Pain:  (Status: New goal.) Long Term Goal  Pain assessment performed Medications reviewed Reviewed provider established plan for pain management; Discussed use of relaxation techniques and/or diversional activities to assist with pain reduction (distraction, imagery, relaxation, massage, acupressure, TENS, heat, and cold application; Reviewed with patient prescribed pharmacological and nonpharmacological pain relief strategies; Assessed social determinant of health barriers;  Discussed LCSW referral for managing chronic health issues-patient will consider and discuss at next telephone visit Advised patient to contact Rheumatology office for earlier appointment  Patient Goals/Self-Care Activities: Take medications as prescribed   Attend all scheduled provider  appointments Call provider office for new concerns or questions  Work with the social worker to address care coordination needs and will continue to work with the clinical team to address health care and disease management related needs       Follow Up:  Patient agrees to Care Plan and Follow-up.  Plan: The Managed Medicaid care management team will reach out to the patient again over the next 60 days.  Date/time of next scheduled RN care management/care coordination outreach:  08/09/23 @ 3:30 pm  Estanislado Emms RN, BSN [place]  Managed [facility] RN Care Coordinator [phone]

## 2023-06-06 NOTE — Patient Instructions (Signed)
Visit Information  Ms. Jokinen was given information about Medicaid Managed Care team care coordination services as a part of their [facility] Community Plan Medicaid benefit. [name] verbally consented to engagement with the [facility] Managed Care team.   If you are experiencing a medical emergency, please call 911 or report to your local emergency department or urgent care.   If you have a non-emergency medical problem during routine business hours, please contact your provider's office and ask to speak with a nurse.   For questions related to your [facility], please call: [phone] or visit the homepage here: [email or web address]  If you would like to schedule transportation through your [facility], please call the following number at least 2 days in advance of your appointment: [phone]   Rides for urgent appointments can also be made after hours by calling Member Services.  Call the Behavioral Health Crisis Line at [phone], at any time, 24 hours a day, 7 days a week. If you are in danger or need immediate medical attention call 911.  If you would like help to quit smoking, call 1-800-QUIT-NOW ((863) 091-4548) OR Espaol: 1-855-Djelo-Ya (7-253-664-4034) o para ms informacin haga clic aqu or Text READY to 742-595 to register via text  Ms. Rana Snare,   Please see education materials related to Health Maintenance and Anemia provided by MyChart link.  Patient verbalizes understanding of instructions and care plan provided today and agrees to view in MyChart. Active MyChart status and patient understanding of how to access instructions and care plan via MyChart confirmed with patient.     Telephone follow up appointment with Managed Medicaid care management team member scheduled for:08/09/23 @ 3:30pm  Estanislado Emms RN, BSN [place]  Managed [facility] RN Care  Coordinator [phone]   Following is a copy of your plan of care:  Care Plan : RN Care Manager Plan of Care  Updates made by Heidi Dach, RN since 06/06/2023 12:00 AM     Problem: Health Management needs related to Health Maintenance and Chronic Pain      Long-Range Goal: Development of Plan of Care to address Health Management needs related to Health Maintenance and Chronic Pain   Start Date: 06/06/2023  Expected End Date: 09/04/2023  Note:   Current Barriers:  Chronic Disease Management support and education needs related to Chronic Pain and Health Maintenance  RNCM Clinical Goal(s):  Patient will verbalize understanding of plan for management of Chronic Pain and Health Maintenance as evidenced by patient reports attend all scheduled medical appointments: 08/31/23 with PCP and Rheumatology 10/24/23 as evidenced by provider documentation        continue to work with RN Care Manager and/or Social Worker to address care management and care coordination needs related to Chronic Pain and Health Maintenance as evidenced by adherence to CM Team Scheduled appointments     through collaboration with Medical illustrator, provider, and care team.   Interventions: Inter-disciplinary care team collaboration (see longitudinal plan of care) Evaluation of current treatment plan related to  self management and patient's adherence to plan as established by provider   Health Maintenance Interventions:  (Status:  New goal.) Long Term Goal Patient interviewed about adult health maintenance status including  Regular eye checkups Regular Dental Care    Provided education about Health Maintenance for Women Discussed the benefits of exercise, advised 30 minutes of exercise for 5 days a week Provided education on Anemia, discussed increasing iron rich foods in  her diet Advised patient to contact [facility] Member Services [phone] for member benefits    Pain:  (Status: New goal.) Long Term Goal  Pain  assessment performed Medications reviewed Reviewed provider established plan for pain management; Discussed use of relaxation techniques and/or diversional activities to assist with pain reduction (distraction, imagery, relaxation, massage, acupressure, TENS, heat, and cold application; Reviewed with patient prescribed pharmacological and nonpharmacological pain relief strategies; Assessed social determinant of health barriers;  Discussed LCSW referral for managing chronic health issues-patient will consider and discuss at next telephone visit Advised patient to contact Rheumatology office for earlier appointment  Patient Goals/Self-Care Activities: Take medications as prescribed   Attend all scheduled provider appointments Call provider office for new concerns or questions  Work with the social worker to address care coordination needs and will continue to work with the clinical team to address health care and disease management related needs

## 2023-06-08 ENCOUNTER — Other Ambulatory Visit: Payer: Self-pay

## 2023-06-08 NOTE — Patient Outreach (Signed)
Medicaid Managed Care Social Work Note  06/08/2023 Name:  Kara Scott MRN:  [id number] DOB:  26-Oct-1991  Kara Scott is an 32 y.o. year old female who is a primary patient of St Vena Austria, NP.  The Medicaid Managed Care Coordination team was consulted for assistance with:  Community Resources   Kara Scott was given information about Medicaid Managed Care Coordination team services today. [name] Patient agreed to services and verbal consent obtained.  Engaged with patient  for by telephone forinitial visit in response to referral for case management and/or care coordination services.   Assessments/Interventions:  Review of past medical history, allergies, medications, health status, including review of consultants reports, laboratory and other test data, was performed as part of comprehensive evaluation and provision of chronic care management services.  SDOH: (Social Determinant of Health) assessments and interventions performed: SDOH Interventions    Flowsheet Row Patient Outreach Telephone from 06/06/2023 in [place] [facility] POPULATION HEALTH DEPARTMENT Office Visit from 05/31/2023 in [facility]  SDOH Interventions    Food Insecurity Interventions Intervention Not Indicated  [Now receiving food stamps] --  Housing Interventions Intervention Not Indicated --  Transportation Interventions Intervention Not Indicated --  Depression Interventions/Treatment  -- Medication, Counseling     BSW completed a telephone outreach with patient. She states she needs some assistance with food and utiltities. She was just approved for foodstamps and needs resources for utilties. Patient states she does not have a cut off notice. She is living in a home her aunt left her, no housing and transportation needs. BSW will mail resources to patient.  Advanced Directives Status:  Not addressed in this encounter.  Care Plan                 No Known  Allergies  Medications Reviewed Today     Reviewed by Kara Dach, RN (Registered Nurse) on 06/06/23 at 1604  Med List Status: <None>   Medication Order Taking? Sig Documenting Provider Last Dose Status Informant  Buprenorphine HCl-Naloxone HCl 8-2 MG FILM [id number] Yes Take by mouth 2 (two) times daily. [provider] Taking Active   cetirizine (ZYRTEC) 10 MG tablet [id number] Yes Take 1 tablet (10 mg total) by mouth daily. St Santa Lighter, Dois Davenport, NP Taking Active   escitalopram (LEXAPRO) 10 MG tablet [id number] No Take 1 tablet (10 mg total) by mouth daily.  Patient not taking: Reported on 06/06/2023   [name], NP Not Taking Active   ibuprofen (ADVIL) 800 MG tablet [id number] Yes Take 800 mg by mouth every 8 (eight) hours as needed. [provider] Taking Active   methylPREDNISolone (MEDROL) 4 MG tablet [id number] No Take 4 mg by mouth daily.  Patient not taking: Reported on 06/06/2023   [provider] Not Taking Active            Med Note [name] Jun 06, 2023  4:04 PM) Completed            Patient Active Problem List   Diagnosis Date Noted   Encounter to establish care 05/31/2023   Routine general medical examination at health care facility 05/31/2023   Anxiety and depression 05/31/2023   Iron deficiency anemia secondary to inadequate dietary iron intake 05/31/2023   Need for Tdap vaccination 05/31/2023   Seasonal allergic rhinitis 05/31/2023   Moderate food insecurity 05/31/2023   Lupus (HCC) 05/20/2023   Tobacco abuse counseling 05/20/2023  Chronic pain 02/12/2023    Conditions to be addressed/monitored per PCP order:   community resources  There are no care plans that you recently modified to display for this patient.   Follow up:  Patient agrees to Care Plan and Follow-up.  Plan: The Managed Medicaid care management team will reach out to the patient again over the next 30 days.  Date/time of next  scheduled Social Work care management/care coordination outreach:  07/11/23 Gus Puma, Kenard Gower, [facility]  Managed [facility] Social Worker [phone]

## 2023-06-08 NOTE — Patient Instructions (Signed)
Visit Information  Ms. Respess was given information about Medicaid Managed Care team care coordination services as a part of their [facility] Community Plan Medicaid benefit. [name] verbally consented to engagement with the [facility] Managed Care team.   If you are experiencing a medical emergency, please call 911 or report to your local emergency department or urgent care.   If you have a non-emergency medical problem during routine business hours, please contact your provider's office and ask to speak with a nurse.   For questions related to your [facility], please call: [phone] or visit the homepage here: [email or web address]  If you would like to schedule transportation through your [facility], please call the following number at least 2 days in advance of your appointment: [phone]   Rides for urgent appointments can also be made after hours by calling Member Services.  Call the Behavioral Health Crisis Line at [phone], at any time, 24 hours a day, 7 days a week. If you are in danger or need immediate medical attention call 911.  If you would like help to quit smoking, call 1-800-QUIT-NOW (786-690-6321) OR Espaol: 1-855-Djelo-Ya (2-725-366-4403) o para ms informacin haga clic aqu or Text READY to 474-259 to register via text  Kara Scott - following are the goals we discussed in your visit today:   Goals Addressed   None      Social Worker will follow up in 30 days.   Gus Puma, Kenard Gower, MHA [facility]  Managed Medicaid Social Worker [phone]   Following is a copy of your plan of care:  There are no care plans that you recently modified to display for this patient.

## 2023-06-17 ENCOUNTER — Ambulatory Visit: Payer: Medicaid Other | Admitting: Family Medicine

## 2023-07-11 ENCOUNTER — Other Ambulatory Visit: Payer: Medicaid Other

## 2023-07-11 NOTE — Patient Outreach (Signed)
  Medicaid Managed Care   Unsuccessful Outreach Note  07/11/2023 Name: Kara Scott MRN: [id number] DOB: [birth year]  Referred by: [name], NP Reason for referral : High Risk Managed Medicaid (MM social work unsuccessful telephone outreach)   An unsuccessful telephone outreach was attempted today. The patient was referred to the case management team for assistance with care management and care coordination.   Follow Up Plan: A HIPAA compliant phone message was left for the patient providing contact information and requesting a return call.   Abelino Derrick, MHA [facility]  Managed [facility] Social Worker [phone]

## 2023-07-11 NOTE — Patient Instructions (Signed)
  Medicaid Managed Care   Unsuccessful Outreach Note  07/11/2023 Name: Kara Scott MRN: [id number] DOB: 12/07/1990  Referred by: [name], NP Reason for referral : High Risk Managed Medicaid (MM social work unsuccessful telephone outreach)   An unsuccessful telephone outreach was attempted today. The patient was referred to the case management team for assistance with care management and care coordination.   Follow Up Plan: The patient has been provided with contact information for the care management team and has been advised to call with any health related questions or concerns.   Abelino Derrick, MHA [facility]  Managed [facility] Social Worker [phone]

## 2023-08-09 ENCOUNTER — Other Ambulatory Visit: Payer: Medicaid Other | Admitting: *Deleted

## 2023-08-09 NOTE — Patient Outreach (Signed)
  Medicaid Managed Care   Unsuccessful Attempt Note   08/09/2023 Name: Kara Scott MRN: [id number] DOB: 11-Feb-1991  Referred by: [name], NP Reason for referral : High Risk Managed Medicaid (Unsuccessful RNCM follow up telephone outreach)   A second unsuccessful telephone outreach was attempted today. The patient was referred to the case management team for assistance with care management and care coordination.    Follow Up Plan: A HIPAA compliant phone message was left for the patient providing contact information and requesting a return call. and The Managed Medicaid care management team will reach out to the patient again over the next 7 days.    Estanislado Emms RN, BSN [place]  [facility] RN Care Coordinator [phone]

## 2023-08-09 NOTE — Patient Instructions (Signed)
Visit Information  Ms. Kara Scott  - as a part of your Medicaid benefit, you are eligible for care management and care coordination services at no cost or copay. I was unable to reach you by phone today but would be happy to help you with your health related needs. Please feel free to call me @ [phone].   A member of the Managed Medicaid care management team will reach out to you again over the next 7 days.   Estanislado Emms RN, BSN [place]  [facility] Health RN Care Coordinator [phone]

## 2023-08-18 ENCOUNTER — Telehealth: Payer: Self-pay

## 2023-08-18 NOTE — Telephone Encounter (Signed)
..   Medicaid Managed Care   Unsuccessful Outreach Note  08/18/2023 Name: Kara Scott MRN: [id number] DOB: February 17, 1991  Referred by: [name], NP Reason for referral : Appointment (I called to reschedule her missed phone appointment with the MM RNCM.)   A second unsuccessful telephone outreach was attempted today. The patient was referred to the case management team for assistance with care management and care coordination.   Follow Up Plan: A HIPAA compliant phone message was left for the patient providing contact information and requesting a return call.  The care management team will reach out to the patient again over the next 7 days.   Weston Settle Care Guide  [facility] Managed  Care Guide [facility]  [phone]

## 2023-08-24 ENCOUNTER — Other Ambulatory Visit: Payer: Medicaid Other | Admitting: *Deleted

## 2023-08-24 NOTE — Patient Outreach (Signed)
Care Management/Care Coordination  RN Case Manager Case Closure Note  08/24/2023 Name: Kara Scott MRN: [id number] DOB: 03/22/1991  Kara Scott is a 32 y.o. year old female who is a primary care patient of [facility], NP. The care management/care coordination team was consulted for assistance with chronic disease management and/or care coordination needs.   Care Plan : RN Care Manager Plan of Care  Updates made by Heidi Dach, RN since 08/24/2023 12:00 AM  Completed 08/24/2023   Problem: Health Management needs related to Health Maintenance and Chronic Pain Resolved 08/24/2023     Long-Range Goal: Development of Plan of Care to address Health Management needs related to Health Maintenance and Chronic Pain Completed 08/24/2023  Start Date: 06/06/2023  Expected End Date: 09/04/2023  Note:   Current Barriers:  Chronic Disease Management support and education needs related to Chronic Pain and Health Maintenance Unable to maintain contact with patient  RNCM Clinical Goal(s):  Patient will verbalize understanding of plan for management of Chronic Pain and Health Maintenance as evidenced by patient reports attend all scheduled medical appointments: 08/31/23 with PCP and Rheumatology 10/24/23 as evidenced by provider documentation        continue to work with RN Care Manager and/or Social Worker to address care management and care coordination needs related to Chronic Pain and Health Maintenance as evidenced by adherence to CM Team Scheduled appointments     through collaboration with Medical illustrator, provider, and care team.   Interventions: Inter-disciplinary care team collaboration (see longitudinal plan of care) Evaluation of current treatment plan related to  self management and patient's adherence to plan as established by provider   Health Maintenance Interventions:  (Status:  Gaol Not Met.) Long Term Goal Patient interviewed about adult health maintenance status  including  Regular eye checkups Regular Dental Care    Provided education about Health Maintenance for Women Discussed the benefits of exercise, advised 30 minutes of exercise for 5 days a week Provided education on Anemia, discussed increasing iron rich foods in her diet Advised patient to contact [facility] Member Services [phone] for member benefits    Pain:  (Status: Goal Not Met.) Long Term Goal  Pain assessment performed Medications reviewed Reviewed provider established plan for pain management; Discussed use of relaxation techniques and/or diversional activities to assist with pain reduction (distraction, imagery, relaxation, massage, acupressure, TENS, heat, and cold application; Reviewed with patient prescribed pharmacological and nonpharmacological pain relief strategies; Assessed social determinant of health barriers;  Discussed LCSW referral for managing chronic health issues-patient will consider and discuss at next telephone visit Advised patient to contact Rheumatology office for earlier appointment  Patient Goals/Self-Care Activities: Take medications as prescribed   Attend all scheduled provider appointments Call provider office for new concerns or questions  Work with the social worker to address care coordination needs and will continue to work with the clinical team to address health care and disease management related needs       Plan: We have been unable to make contact with the patient for follow up. The care management team is available to follow up with the patient should new care management/care coordination needs arise.   Estanislado Emms RN, BSN [place]  [facility] Health RN Care Coordinator [phone]

## 2023-08-30 NOTE — Progress Notes (Unsigned)
Established Patient Office Visit  Subjective   Patient ID: Kara Scott, female    DOB: [birth year]  Age: 32 y.o. MRN: [id number]  No chief complaint on file.   HPI  Patient Active Problem List   Diagnosis Date Noted   Encounter to establish care 05/31/2023   Routine general medical examination at health care facility 05/31/2023   Anxiety and depression 05/31/2023   Iron deficiency anemia secondary to inadequate dietary iron intake 05/31/2023   Need for Tdap vaccination 05/31/2023   Seasonal allergic rhinitis 05/31/2023   Moderate food insecurity 05/31/2023   Lupus (HCC) 05/20/2023   Tobacco abuse counseling 05/20/2023   Chronic pain 02/12/2023   Past Medical History:  Diagnosis Date   Chronic pain    Lupus (HCC)    Pain management    No past surgical history on file. Social History   Tobacco Use   Smoking status: Never   Smokeless tobacco: Never  Vaping Use   Vaping status: Never Used  Substance Use Topics   Alcohol use: No   Drug use: No   Social History   Socioeconomic History   Marital status: Single    Spouse name: Not on file   Number of children: Not on file   Years of education: Not on file   Highest education level: Not on file  Occupational History   Not on file  Tobacco Use   Smoking status: Never   Smokeless tobacco: Never  Vaping Use   Vaping status: Never Used  Substance and Sexual Activity   Alcohol use: No   Drug use: No   Sexual activity: Yes    Birth control/protection: None  Other Topics Concern   Not on file  Social History Narrative   Not on file   Social Determinants of Health   Financial Resource Strain: High Risk (05/31/2023)   Overall Financial Resource Strain (CARDIA)    Difficulty of Paying Living Expenses: Very hard  Food Insecurity: Food Insecurity Present (06/06/2023)   Hunger Vital Sign    Worried About Running Out of Food in the Last Year: Sometimes true    Ran Out of Food in the Last Year: Sometimes true   Transportation Needs: No Transportation Needs (06/06/2023)   PRAPARE - Administrator, Civil Service (Medical): No    Lack of Transportation (Non-Medical): No  Physical Activity: Not on file  Stress: Not on file  Social Connections: Not on file  Intimate Partner Violence: Not At Risk (06/06/2023)   Humiliation, Afraid, Rape, and Kick questionnaire    Fear of Current or Ex-Partner: No    Emotionally Abused: No    Physically Abused: No    Sexually Abused: No   Family Status  Relation Name Status   Mother  Alive   Father  Alive   MGM  (Not Specified)  No partnership data on file   Family History  Problem Relation Age of Onset   Healthy Mother    Healthy Father    Diabetes Maternal Grandmother    No Known Allergies    ROS Negative unless indicated in HPI   Objective:     There were no vitals taken for this visit. BP Readings from Last 3 Encounters:  05/31/23 129/64  02/12/23 139/89  04/10/20 136/71   Wt Readings from Last 3 Encounters:  05/31/23 120 lb 9.6 oz (54.7 kg)  02/12/23 135 lb (61.2 kg)  04/10/20 125 lb (56.7 kg)      Physical Exam  No results found for any visits on 08/31/23.  Last CBC Lab Results  Component Value Date   WBC 9.4 05/31/2023   HGB 11.3 05/31/2023   HCT 34.9 05/31/2023   MCV 83 05/31/2023   MCH 26.7 05/31/2023   RDW 13.4 05/31/2023   PLT 337 05/31/2023   Last metabolic panel Lab Results  Component Value Date   GLUCOSE 78 05/31/2023   NA 137 05/31/2023   K 4.0 05/31/2023   CL 103 05/31/2023   CO2 21 05/31/2023   BUN 16 05/31/2023   CREATININE 0.62 05/31/2023   EGFR 120 05/31/2023   CALCIUM 9.1 05/31/2023   PROT 7.1 05/31/2023   ALBUMIN 4.6 05/31/2023   LABGLOB 2.5 05/31/2023   BILITOT <0.2 05/31/2023   ALKPHOS 63 05/31/2023   AST 25 05/31/2023   ALT 18 05/31/2023   ANIONGAP 7 02/12/2023   Last lipids Lab Results  Component Value Date   CHOL 161 05/31/2023   HDL 55 05/31/2023   LDLCALC 87 05/31/2023    TRIG 103 05/31/2023   CHOLHDL 2.9 05/31/2023   Last hemoglobin A1c Lab Results  Component Value Date   HGBA1C 5.0 05/31/2023   Last thyroid functions Lab Results  Component Value Date   TSH 1.990 05/31/2023   T4TOTAL 6.1 05/31/2023        Assessment & Plan:  There are no diagnoses linked to this encounter.  Continue healthy lifestyle choices, including diet (rich in fruits, vegetables, and lean proteins, and low in salt and simple carbohydrates) and exercise (at least 30 minutes of moderate physical activity daily).     The above assessment and management plan was discussed with the patient. The patient verbalized understanding of and has agreed to the management plan. Patient is aware to call the clinic if they develop any new symptoms or if symptoms persist or worsen. Patient is aware when to return to the clinic for a follow-up visit. Patient educated on when it is appropriate to go to the emergency department.   Arrie Aran Santa Lighter, DNP [facility] Medicine [address] [place], [place] [address] [phone]   [email or web address]  Janee Morn, [place]

## 2023-08-31 ENCOUNTER — Ambulatory Visit: Payer: Medicaid Other | Admitting: Nurse Practitioner

## 2023-08-31 ENCOUNTER — Encounter: Payer: Self-pay | Admitting: Nurse Practitioner

## 2023-08-31 VITALS — BP 127/68 | HR 68 | Temp 97.8°F | Ht 64.0 in | Wt 126.4 lb

## 2023-08-31 DIAGNOSIS — F32A Depression, unspecified: Secondary | ICD-10-CM | POA: Diagnosis not present

## 2023-08-31 DIAGNOSIS — H60313 Diffuse otitis externa, bilateral: Secondary | ICD-10-CM | POA: Insufficient documentation

## 2023-08-31 DIAGNOSIS — F419 Anxiety disorder, unspecified: Secondary | ICD-10-CM | POA: Diagnosis not present

## 2023-08-31 MED ORDER — ESCITALOPRAM OXALATE 10 MG PO TABS: 10.0000 mg | ORAL_TABLET | Freq: Every day | ORAL | 0 refills | Status: DC

## 2023-08-31 MED ORDER — OFLOXACIN 0.3 % OT SOLN: 5.0000 [drp] | Freq: Every day | OTIC | 0 refills | Status: DC

## 2023-10-04 DIAGNOSIS — M25532 Pain in left wrist: Secondary | ICD-10-CM | POA: Diagnosis not present

## 2023-10-24 ENCOUNTER — Ambulatory Visit: Payer: Medicaid Other | Attending: Internal Medicine | Admitting: Internal Medicine

## 2023-10-24 ENCOUNTER — Encounter: Payer: Self-pay | Admitting: Internal Medicine

## 2023-10-24 VITALS — BP 121/76 | HR 72 | Resp 14 | Ht 64.0 in | Wt 129.0 lb

## 2023-10-24 DIAGNOSIS — J302 Other seasonal allergic rhinitis: Secondary | ICD-10-CM

## 2023-10-24 DIAGNOSIS — G8929 Other chronic pain: Secondary | ICD-10-CM

## 2023-10-24 DIAGNOSIS — R591 Generalized enlarged lymph nodes: Secondary | ICD-10-CM | POA: Diagnosis not present

## 2023-10-24 DIAGNOSIS — M329 Systemic lupus erythematosus, unspecified: Secondary | ICD-10-CM | POA: Diagnosis not present

## 2023-10-24 DIAGNOSIS — L568 Other specified acute skin changes due to ultraviolet radiation: Secondary | ICD-10-CM | POA: Diagnosis not present

## 2023-10-24 MED ORDER — IBUPROFEN 800 MG PO TABS: 800.0000 mg | ORAL_TABLET | Freq: Three times a day (TID) | ORAL | 1 refills | Status: DC | PRN

## 2023-10-24 NOTE — Patient Instructions (Signed)
 Hydroxychloroquine Tablets What is this medication? HYDROXYCHLOROQUINE (hye drox ee KLOR oh kwin) treats autoimmune conditions, such as rheumatoid arthritis and lupus. It works by slowing down an overactive immune system. It may also be used to prevent and treat malaria. It works by killing the parasite that causes malaria. It belongs to a group of medications called DMARDs. This medicine may be used for other purposes; ask your health care provider or pharmacist if you have questions. COMMON BRAND NAME(S): Plaquenil, Quineprox, SOVUNA What should I tell my care team before I take this medication? They need to know if you have any of these conditions: Diabetes Eye disease, vision problems Frequently drink alcohol G6PD deficiency Heart disease Irregular heartbeat or rhythm Kidney disease Liver disease Porphyria Psoriasis An unusual or allergic reaction to hydroxychloroquine, other medications, foods, dyes, or preservatives Pregnant or trying to get pregnant Breastfeeding How should I use this medication? Take this medication by mouth with water. Take it as directed on the prescription label. Do not cut, crush, or chew this medication. Swallow the tablets whole. Take it with food. Do not take it more than directed. Take all of this medication unless your care team tells you to stop it early. Keep taking it even if you think you are better. Take products with antacids in them at a different time of day than this medication. Take this medication 4 hours before or 4 hours after antacids. Talk to your care team if you have questions. Talk to your care team about the use of this medication in children. While this medication may be prescribed for selected conditions, precautions do apply. Overdosage: If you think you have taken too much of this medicine contact a poison control center or emergency room at once. NOTE: This medicine is only for you. Do not share this medicine with others. What if I  miss a dose? If you miss a dose, take it as soon as you can. If it is almost time for your next dose, take only that dose. Do not take double or extra doses. What may interact with this medication? Do not take this medication with any of the following: Cisapride Dronedarone Pimozide Thioridazine This medication may also interact with the following: Ampicillin Antacids Cimetidine Cyclosporine Digoxin Kaolin Medications for diabetes, such as insulin, glipizide, glyburide Medications for seizures, such as carbamazepine, phenobarbital, phenytoin Mefloquine Methotrexate Other medications that cause heart rhythm changes Praziquantel This list may not describe all possible interactions. Give your health care provider a list of all the medicines, herbs, non-prescription drugs, or dietary supplements you use. Also tell them if you smoke, drink alcohol, or use illegal drugs. Some items may interact with your medicine. What should I watch for while using this medication? Visit your care team for regular checks on your progress. Tell your care team if your symptoms do not start to get better or if they get worse. You may need blood work done while you are taking this medication. If you take other medications that can affect heart rhythm, you may need more testing. Talk to your care team if you have questions. Your vision may be tested before and during use of this medication. Tell your care team right away if you have any change in your eyesight. This medication may cause serious skin reactions. They can happen weeks to months after starting the medication. Contact your care team right away if you notice fevers or flu-like symptoms with a rash. The rash may be red or purple and then  turn into blisters or peeling of the skin. Or, you might notice a red rash with swelling of the face, lips or lymph nodes in your neck or under your arms. If you or your family notice any changes in your behavior, such as  new or worsening depression, thoughts of harming yourself, anxiety, or other unusual or disturbing thoughts, or memory loss, call your care team right away. What side effects may I notice from receiving this medication? Side effects that you should report to your care team as soon as possible: Allergic reactions--skin rash, itching, hives, swelling of the face, lips, tongue, or throat Aplastic anemia--unusual weakness or fatigue, dizziness, headache, trouble breathing, increased bleeding or bruising Change in vision Heart rhythm changes--fast or irregular heartbeat, dizziness, feeling faint or lightheaded, chest pain, trouble breathing Infection--fever, chills, cough, or sore throat Low blood sugar (hypoglycemia)--tremors or shaking, anxiety, sweating, cold or clammy skin, confusion, dizziness, rapid heartbeat Muscle injury--unusual weakness or fatigue, muscle pain, dark yellow or brown urine, decrease in amount of urine Pain, tingling, or numbness in the hands or feet Rash, fever, and swollen lymph nodes Redness, blistering, peeling, or loosening of the skin, including inside the mouth Thoughts of suicide or self-harm, worsening mood, or feelings of depression Unusual bruising or bleeding Side effects that usually do not require medical attention (report to your care team if they continue or are bothersome): Diarrhea Headache Nausea Stomach pain Vomiting This list may not describe all possible side effects. Call your doctor for medical advice about side effects. You may report side effects to FDA at 1-800-FDA-1088. Where should I keep my medication? Keep out of the reach of children and pets. Store at room temperature up to 30 degrees C (86 degrees F). Protect from light. Get rid of any unused medication after the expiration date. To get rid of medications that are no longer needed or have expired: Take the medication to a medication take-back program. Check with your pharmacy or law  enforcement to find a location. If you cannot return the medication, check the label or package insert to see if the medication should be thrown out in the garbage or flushed down the toilet. If you are not sure, ask your care team. If it is safe to put it in the trash, empty the medication out of the container. Mix the medication with cat litter, dirt, coffee grounds, or other unwanted substance. Seal the mixture in a bag or container. Put it in the trash. NOTE: This sheet is a summary. It may not cover all possible information. If you have questions about this medicine, talk to your doctor, pharmacist, or health care provider.  2024 Elsevier/Gold Standard (2022-05-31 00:00:00)

## 2023-10-24 NOTE — Progress Notes (Signed)
Office Visit Note  Patient: Kara Scott             Date of Birth: January 06, 1991           MRN: [id number]             PCP: [name], NP Referring: Carlis Stable* Visit Date: 10/24/2023 Occupation: Various, farmwork, warehouse  Subjective:  New Patient (Initial Visit) (Patient states she is having joint pain in her ankles, knees, and wrists. Patient states she needs a refill of her Ibuprofen 800 MG. )   Discussed the use of AI scribe software for clinical note transcription with the patient, who gave verbal consent to proceed.  History of Present Illness   Kara Scott is a 32 y.o. female here for evaluation and management of chronic joint pain and skin rashes previously diagnosed with lupus. She was diagnosed in 2019 at [facility] in 2016 and was never seen by a rheumatologist. She recently saw PCP office due to severe flare up after sun exposure this summer with widespread rashes.  The pain has been severe enough to disrupt sleep and has been managed with ibuprofen 800 mg. Recently, the patient experienced an episode of intense wrist pain, prompting a medical visit where a cast was applied to rule out fracture. She is wearing a rigid wrist brace for support and symptoms are partially improved. The patient also reports joint swelling in the knees and ankles.  Over the past decade, the patient has experienced skin sensitivity to sun exposure, resulting in flare-ups and rashes. The most severe episode occurred three months ago, causing blistering and excessive sweating within 15 minutes of sun exposure. The patient also reports hair loss on multiple occasions.  The patient has a history of kidney problems, specifically recurrent kidney infections, and experiences frequent urination. There is no history of blood clots or abnormal bleeding.  The patient's active lifestyle, including work in Designer, fashion/clothing, fast food, and warehouse work, has been  significantly impacted by these symptoms, leading to a decrease in activity levels. The patient also reports previous joint injuries, including a cracked ankle and wrist.   The patient has a history of smoking but quit approximately 20 years ago.  She denies peripheral circulation problems. No history of blood clots or bleeding.  Patient's mother was present as well and provided some collateral history.  Activities of Daily Living:  Patient reports morning stiffness for 6 hours.   Patient Reports nocturnal pain.  Difficulty dressing/grooming: Reports Difficulty climbing stairs: Reports Difficulty getting out of chair: Denies Difficulty using hands for taps, buttons, cutlery, and/or writing: Reports  Review of Systems  Constitutional:  Positive for fatigue.  HENT:  Negative for mouth sores and mouth dryness.   Eyes:  Positive for dryness.  Respiratory:  Negative for shortness of breath.   Cardiovascular:  Negative for chest pain and palpitations.  Gastrointestinal:  Positive for constipation. Negative for blood in stool and diarrhea.  Endocrine: Positive for increased urination.  Genitourinary:  Negative for involuntary urination.  Musculoskeletal:  Positive for joint pain, gait problem, joint pain, joint swelling, myalgias, muscle weakness, morning stiffness, muscle tenderness and myalgias.  Skin:  Positive for rash, hair loss and sensitivity to sunlight. Negative for color change.  Allergic/Immunologic: Positive for susceptible to infections.  Neurological:  Positive for headaches. Negative for dizziness.  Hematological:  Negative for swollen glands.  Psychiatric/Behavioral:  Positive for sleep disturbance. Negative for depressed mood. The patient is nervous/anxious.  PMFS History:  Patient Active Problem List   Diagnosis Date Noted   Photosensitivity 10/24/2023   Lymphadenopathy 10/24/2023   Acute diffuse otitis externa of both ears 08/31/2023   Encounter to establish care  05/31/2023   Routine general medical examination at health care facility 05/31/2023   Anxiety and depression 05/31/2023   Iron deficiency anemia secondary to inadequate dietary iron intake 05/31/2023   Need for Tdap vaccination 05/31/2023   Seasonal allergic rhinitis 05/31/2023   Moderate food insecurity 05/31/2023   Personal history of systemic lupus erythematosus (SLE) (HCC) 05/20/2023   Tobacco abuse counseling 05/20/2023   Chronic pain 02/12/2023    Past Medical History:  Diagnosis Date   Chronic pain    Lupus    Pain management     Family History  Problem Relation Age of Onset   Healthy Mother    Healthy Father    Diabetes Maternal Grandmother    History reviewed. No pertinent surgical history. Social History   Social History Narrative   Not on file   Immunization History  Administered Date(s) Administered   DTP 07/25/1990, 05/14/1991, 12/09/1993   Dtap, Unspecified 02/03/1989, 05/31/1989   HIB, Unspecified 07/25/1990   Hep B, Unspecified 08/25/2000, 09/26/2000, 02/13/2001   MMR 07/25/1990, 12/09/1993   OPV 07/25/1990, 12/09/1993   Polio, Unspecified 02/03/1989, 05/31/1989   Tdap 05/31/2023     Objective: Vital Signs: BP 121/76 (BP Location: Right Arm, Patient Position: Sitting, Cuff Size: Normal)   Pulse 72   Resp 14   Ht 5\' 4"  (1.626 m)   Wt 129 lb (58.5 kg)   LMP 10/20/2023   BMI 22.14 kg/m    Physical Exam HENT:     Mouth/Throat:     Mouth: Mucous membranes are moist.     Pharynx: Oropharynx is clear.  Eyes:     Conjunctiva/sclera: Conjunctivae normal.  Neck:     Comments: Shotty cervical and axillary lymph nodes, mild tenderness Cardiovascular:     Rate and Rhythm: Normal rate and regular rhythm.  Pulmonary:     Effort: Pulmonary effort is normal.     Breath sounds: Normal breath sounds.  Musculoskeletal:     Right lower leg: No edema.     Left lower leg: No edema.  Lymphadenopathy:     Cervical: Cervical adenopathy present.  Skin:     General: Skin is warm and dry.     Comments: No digital pitting or lesions  Neurological:     Mental Status: She is alert.  Psychiatric:        Mood and Affect: Mood normal.      Musculoskeletal Exam:  Shoulders full ROM no tenderness or swelling Elbows full ROM no tenderness or swelling Left wrist tenderness to pressure, pain with flexion, percussion with pain radiating to fingers and into forearm Fingers full ROM no tenderness or swelling Knees full ROM no tenderness or swelling   Investigation: No additional findings.  Imaging: No results found.  Recent Labs: Lab Results  Component Value Date   WBC 9.4 05/31/2023   HGB 11.3 05/31/2023   PLT 337 05/31/2023   NA 137 05/31/2023   K 4.0 05/31/2023   CL 103 05/31/2023   CO2 21 05/31/2023   GLUCOSE 78 05/31/2023   BUN 16 05/31/2023   CREATININE 0.62 05/31/2023   BILITOT <0.2 05/31/2023   ALKPHOS 63 05/31/2023   AST 25 05/31/2023   ALT 18 05/31/2023   PROT 7.1 05/31/2023   ALBUMIN 4.6 05/31/2023   CALCIUM 9.1  05/31/2023   GFRAA >60 11/02/2017    Speciality Comments: No specialty comments available.  Procedures:  No procedures performed Allergies: Patient has no known allergies.   Assessment / Plan:     Visit Diagnoses: Personal history of systemic lupus erythematosus (SLE) (HCC)  Lymphadenopathy - Plan: Sedimentation rate, ANA, RNP Antibody, Anti-Smith antibody, Sjogrens syndrome-A extractable nuclear antibody, Sjogrens syndrome-B extractable nuclear antibody, Anti-DNA antibody, double-stranded, C3 and C4, Protein / creatinine ratio, urine, CBC with Differential/Platelet  History of SLE but unclear specific criteria without records or previous labs available. Symptoms sound consistent for lymphadenopathy, photosensitive rashes, and inflammatory arthritis. Checking serology today also sed rate, complements, and CBC for disease activity assessment. Also checking urine P/C ratio screening for renal involvement. Low  threshold to start on HCQ if consistent results.   Other chronic pain  Refill for ibuprofen 800mg  for arthritis pain as needed but would need to stop if renal complication.  Photosensitivity  On daily vitamin D supplement. Reviewed skin protection with UV avoidance as trigger for disease activity. 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   Orders: Orders Placed This Encounter  Procedures   Sedimentation rate   ANA   RNP Antibody   Anti-Smith antibody   Sjogrens syndrome-A extractable nuclear antibody   Sjogrens syndrome-B extractable nuclear antibody   Anti-DNA antibody, double-stranded   C3 and C4   Protein / creatinine ratio, urine   CBC with Differential/Platelet   Meds ordered this encounter  Medications   ibuprofen (ADVIL) 800 MG tablet    Sig: Take 1 tablet (800 mg total) by mouth every 8 (eight) hours as needed.    Dispense:  60 tablet    Refill:  1     Follow-Up Instructions: Return in about 8 weeks (around 12/19/2023) for New pt ?SLE HCQ start f/u 2mos.   Fuller Plan, MD  Note - This record has been created using [employer].  Chart creation errors have been sought, but may not always  have been located. Such creation errors do not reflect on  the standard of medical care.

## 2023-10-26 LAB — CBC WITH DIFFERENTIAL/PLATELET
Absolute Lymphocytes: 2121 {cells}/uL (ref 850–3900)
Absolute Monocytes: 431 {cells}/uL (ref 200–950)
Basophils Absolute: 44 {cells}/uL (ref 0–200)
Basophils Relative: 0.5 %
Eosinophils Absolute: 308 {cells}/uL (ref 15–500)
Eosinophils Relative: 3.5 %
HCT: 36.6 % (ref 35.0–45.0)
Hemoglobin: 12 g/dL (ref 11.7–15.5)
MCH: 27 pg (ref 27.0–33.0)
MCHC: 32.8 g/dL (ref 32.0–36.0)
MCV: 82.4 fL (ref 80.0–100.0)
MPV: 9.9 fL (ref 7.5–12.5)
Monocytes Relative: 4.9 %
Neutro Abs: 5896 {cells}/uL (ref 1500–7800)
Neutrophils Relative %: 67 %
Platelets: 352 10*3/uL (ref 140–400)
RBC: 4.44 10*6/uL (ref 3.80–5.10)
RDW: 12.6 % (ref 11.0–15.0)
Total Lymphocyte: 24.1 %
WBC: 8.8 10*3/uL (ref 3.8–10.8)

## 2023-10-26 LAB — C3 AND C4
C3 Complement: 115 mg/dL (ref 83–193)
C4 Complement: 24 mg/dL (ref 15–57)

## 2023-10-26 LAB — ANTI-DNA ANTIBODY, DOUBLE-STRANDED: ds DNA Ab: 2 [IU]/mL

## 2023-10-26 LAB — RNP ANTIBODY: Ribonucleic Protein(ENA) Antibody, IgG: <1 AI

## 2023-10-26 LAB — SEDIMENTATION RATE: Sed Rate: 6 mm/h (ref 0–20)

## 2023-10-26 LAB — PROTEIN / CREATININE RATIO, URINE
Creatinine, Urine: 73 mg/dL (ref 20–275)
Protein/Creat Ratio: 82 mg/g{creat} (ref 24–184)
Protein/Creatinine Ratio: 0.082 mg/mg{creat} (ref 0.024–0.184)
Total Protein, Urine: 6 mg/dL (ref 5–24)

## 2023-10-26 LAB — ANTI-NUCLEAR AB-TITER (ANA TITER): ANA Titer 1: 1:80 {titer} — ABNORMAL HIGH

## 2023-10-26 LAB — ANTI-SMITH ANTIBODY: ENA SM Ab Ser-aCnc: <1 AI

## 2023-10-26 LAB — SJOGRENS SYNDROME-B EXTRACTABLE NUCLEAR ANTIBODY: SSB (La) (ENA) Antibody, IgG: <1 AI

## 2023-10-26 LAB — SJOGRENS SYNDROME-A EXTRACTABLE NUCLEAR ANTIBODY: SSA (Ro) (ENA) Antibody, IgG: <1 AI

## 2023-10-26 LAB — ANA: Anti Nuclear Antibody (ANA): POSITIVE — AB

## 2023-11-23 NOTE — Progress Notes (Deleted)
Established Patient Office Visit  Subjective  Patient ID: Kara Scott, female    DOB: 1991-01-29  Age: 32 y.o. MRN: [id number]  No chief complaint on file.   HPI  Patient Active Problem List   Diagnosis Date Noted   Photosensitivity 10/24/2023   Lymphadenopathy 10/24/2023   Acute diffuse otitis externa of both ears 08/31/2023   Encounter to establish care 05/31/2023   Routine general medical examination at health care facility 05/31/2023   Anxiety and depression 05/31/2023   Iron deficiency anemia secondary to inadequate dietary iron intake 05/31/2023   Need for Tdap vaccination 05/31/2023   Seasonal allergic rhinitis 05/31/2023   Moderate food insecurity 05/31/2023   Personal history of systemic lupus erythematosus (SLE) (HCC) 05/20/2023   Tobacco abuse counseling 05/20/2023   Chronic pain 02/12/2023   Past Medical History:  Diagnosis Date   Chronic pain    Lupus    Pain management    No past surgical history on file. Social History   Tobacco Use   Smoking status: Never    Passive exposure: Never   Smokeless tobacco: Never  Vaping Use   Vaping status: Never Used  Substance Use Topics   Alcohol use: No   Drug use: No   Social History   Socioeconomic History   Marital status: Single    Spouse name: Not on file   Number of children: Not on file   Years of education: Not on file   Highest education level: Not on file  Occupational History   Not on file  Tobacco Use   Smoking status: Never    Passive exposure: Never   Smokeless tobacco: Never  Vaping Use   Vaping status: Never Used  Substance and Sexual Activity   Alcohol use: No   Drug use: No   Sexual activity: Yes    Birth control/protection: None  Other Topics Concern   Not on file  Social History Narrative   Not on file   Social Drivers of Health   Financial Resource Strain: High Risk (05/31/2023)   Overall Financial Resource Strain (CARDIA)    Difficulty of Paying Living Expenses: Very  hard  Food Insecurity: Food Insecurity Present (06/06/2023)   Hunger Vital Sign    Worried About Running Out of Food in the Last Year: Sometimes true    Ran Out of Food in the Last Year: Sometimes true  Transportation Needs: No Transportation Needs (06/06/2023)   PRAPARE - Administrator, Civil Service (Medical): No    Lack of Transportation (Non-Medical): No  Physical Activity: Not on file  Stress: Not on file  Social Connections: Not on file  Intimate Partner Violence: Not At Risk (06/06/2023)   Humiliation, Afraid, Rape, and Kick questionnaire    Fear of Current or Ex-Partner: No    Emotionally Abused: No    Physically Abused: No    Sexually Abused: No   Family Status  Relation Name Status   Mother  Alive   Father  Alive   Brother  Alive   Brother  Alive   MGM  (Not Specified)  No partnership data on file   Family History  Problem Relation Age of Onset   Healthy Mother    Healthy Father    Diabetes Maternal Grandmother    No Known Allergies    ROS Negative unless indicated in HPI   Objective:     There were no vitals taken for this visit. BP Readings from Last 3 Encounters:  10/24/23 121/76  08/31/23 127/68  05/31/23 129/64   Wt Readings from Last 3 Encounters:  10/24/23 129 lb (58.5 kg)  08/31/23 126 lb 6.4 oz (57.3 kg)  05/31/23 120 lb 9.6 oz (54.7 kg)      Physical Exam   No results found for any visits on 12/01/23.  Last CBC Lab Results  Component Value Date   WBC 8.8 10/24/2023   HGB 12.0 10/24/2023   HCT 36.6 10/24/2023   MCV 82.4 10/24/2023   MCH 27.0 10/24/2023   RDW 12.6 10/24/2023   PLT 352 10/24/2023   Last metabolic panel Lab Results  Component Value Date   GLUCOSE 78 05/31/2023   NA 137 05/31/2023   K 4.0 05/31/2023   CL 103 05/31/2023   CO2 21 05/31/2023   BUN 16 05/31/2023   CREATININE 0.62 05/31/2023   EGFR 120 05/31/2023   CALCIUM 9.1 05/31/2023   PROT 7.1 05/31/2023   ALBUMIN 4.6 05/31/2023   LABGLOB 2.5  05/31/2023   BILITOT <0.2 05/31/2023   ALKPHOS 63 05/31/2023   AST 25 05/31/2023   ALT 18 05/31/2023   ANIONGAP 7 02/12/2023   Last lipids Lab Results  Component Value Date   CHOL 161 05/31/2023   HDL 55 05/31/2023   LDLCALC 87 05/31/2023   TRIG 103 05/31/2023   CHOLHDL 2.9 05/31/2023   Last hemoglobin A1c Lab Results  Component Value Date   HGBA1C 5.0 05/31/2023   Last thyroid functions Lab Results  Component Value Date   TSH 1.990 05/31/2023   T4TOTAL 6.1 05/31/2023        Assessment & Plan:  There are no diagnoses linked to this encounter. Continue healthy lifestyle choices, including diet (rich in fruits, vegetables, and lean proteins, and low in salt and simple carbohydrates) and exercise (at least 30 minutes of moderate physical activity daily).     The above assessment and management plan was discussed with the patient. The patient verbalized understanding of and has agreed to the management plan. Patient is aware to call the clinic if they develop any new symptoms or if symptoms persist or worsen. Patient is aware when to return to the clinic for a follow-up visit. Patient educated on when it is appropriate to go to the emergency department.  No follow-ups on file.    @Xiana Carns  [place] Lighter, DNP [facility] [address] [place], [place] [address] [phone]    Note: This document was prepared by [employer] voice dictation technology and any errors that results from this process are unintentional.

## 2023-12-01 ENCOUNTER — Ambulatory Visit: Payer: Medicaid Other | Admitting: Nurse Practitioner

## 2023-12-05 ENCOUNTER — Encounter: Payer: Self-pay | Admitting: Nurse Practitioner

## 2023-12-08 NOTE — Progress Notes (Deleted)
 Established Patient Office Visit  Subjective  Patient ID: Kara Scott, female    DOB: 06-25-1991  Age: 33 y.o. MRN: [id number]  No chief complaint on file.   HPI  Patient Active Problem List   Diagnosis Date Noted   Photosensitivity 10/24/2023   Lymphadenopathy 10/24/2023   Acute diffuse otitis externa of both ears 08/31/2023   Encounter to establish care 05/31/2023   Routine general medical examination at health care facility 05/31/2023   Anxiety and depression 05/31/2023   Iron deficiency anemia secondary to inadequate dietary iron intake 05/31/2023   Need for Tdap vaccination 05/31/2023   Seasonal allergic rhinitis 05/31/2023   Moderate food insecurity 05/31/2023   Personal history of systemic lupus erythematosus (SLE) (HCC) 05/20/2023   Tobacco abuse counseling 05/20/2023   Chronic pain 02/12/2023   Past Medical History:  Diagnosis Date   Chronic pain    Lupus    Pain management    No past surgical history on file. Social History   Tobacco Use   Smoking status: Never    Passive exposure: Never   Smokeless tobacco: Never  Vaping Use   Vaping status: Never Used  Substance Use Topics   Alcohol use: No   Drug use: No   Social History   Socioeconomic History   Marital status: Single    Spouse name: Not on file   Number of children: Not on file   Years of education: Not on file   Highest education level: Not on file  Occupational History   Not on file  Tobacco Use   Smoking status: Never    Passive exposure: Never   Smokeless tobacco: Never  Vaping Use   Vaping status: Never Used  Substance and Sexual Activity   Alcohol use: No   Drug use: No   Sexual activity: Yes    Birth control/protection: None  Other Topics Concern   Not on file  Social History Narrative   Not on file   Social Drivers of Health   Financial Resource Strain: High Risk (05/31/2023)   Overall Financial Resource Strain (CARDIA)    Difficulty of Paying Living Expenses: Very  hard  Food Insecurity: Food Insecurity Present (06/06/2023)   Hunger Vital Sign    Worried About Running Out of Food in the Last Year: Sometimes true    Ran Out of Food in the Last Year: Sometimes true  Transportation Needs: No Transportation Needs (06/06/2023)   PRAPARE - Administrator, Civil Service (Medical): No    Lack of Transportation (Non-Medical): No  Physical Activity: Not on file  Stress: Not on file  Social Connections: Not on file  Intimate Partner Violence: Not At Risk (06/06/2023)   Humiliation, Afraid, Rape, and Kick questionnaire    Fear of Current or Ex-Partner: No    Emotionally Abused: No    Physically Abused: No    Sexually Abused: No   Family Status  Relation Name Status   Mother  Alive   Father  Alive   Brother  Alive   Brother  Alive   MGM  (Not Specified)  No partnership data on file   Family History  Problem Relation Age of Onset   Healthy Mother    Healthy Father    Diabetes Maternal Grandmother    No Known Allergies    ROS Negative unless indicated in HPI   Objective:     There were no vitals taken for this visit. BP Readings from Last 3 Encounters:  10/24/23 121/76  08/31/23 127/68  05/31/23 129/64   Wt Readings from Last 3 Encounters:  10/24/23 129 lb (58.5 kg)  08/31/23 126 lb 6.4 oz (57.3 kg)  05/31/23 120 lb 9.6 oz (54.7 kg)      Physical Exam   No results found for any visits on 12/12/23.  Last CBC Lab Results  Component Value Date   WBC 8.8 10/24/2023   HGB 12.0 10/24/2023   HCT 36.6 10/24/2023   MCV 82.4 10/24/2023   MCH 27.0 10/24/2023   RDW 12.6 10/24/2023   PLT 352 10/24/2023   Last metabolic panel Lab Results  Component Value Date   GLUCOSE 78 05/31/2023   NA 137 05/31/2023   K 4.0 05/31/2023   CL 103 05/31/2023   CO2 21 05/31/2023   BUN 16 05/31/2023   CREATININE 0.62 05/31/2023   EGFR 120 05/31/2023   CALCIUM 9.1 05/31/2023   PROT 7.1 05/31/2023   ALBUMIN 4.6 05/31/2023   LABGLOB 2.5  05/31/2023   BILITOT <0.2 05/31/2023   ALKPHOS 63 05/31/2023   AST 25 05/31/2023   ALT 18 05/31/2023   ANIONGAP 7 02/12/2023   Last lipids Lab Results  Component Value Date   CHOL 161 05/31/2023   HDL 55 05/31/2023   LDLCALC 87 05/31/2023   TRIG 103 05/31/2023   CHOLHDL 2.9 05/31/2023   Last hemoglobin A1c Lab Results  Component Value Date   HGBA1C 5.0 05/31/2023   Last thyroid  functions Lab Results  Component Value Date   TSH 1.990 05/31/2023   T4TOTAL 6.1 05/31/2023        Assessment & Plan:  There are no diagnoses linked to this encounter. Continue healthy lifestyle choices, including diet (rich in fruits, vegetables, and lean proteins, and low in salt and simple carbohydrates) and exercise (at least 30 minutes of moderate physical activity daily).     The above assessment and management plan was discussed with the patient. The patient verbalized understanding of and has agreed to the management plan. Patient is aware to call the clinic if they develop any new symptoms or if symptoms persist or worsen. Patient is aware when to return to the clinic for a follow-up visit. Patient educated on when it is appropriate to go to the emergency department.  No follow-ups on file.    Kemaria Dedic [place], DNP [facility] [address] [place], [place] [address] [phone]    Note: This document was prepared by [employer] voice dictation technology and any errors that results from this process are unintentional.

## 2023-12-12 ENCOUNTER — Ambulatory Visit: Payer: Medicaid Other | Admitting: Nurse Practitioner

## 2023-12-19 ENCOUNTER — Ambulatory Visit: Payer: Medicaid Other | Admitting: Family Medicine

## 2023-12-19 NOTE — Progress Notes (Signed)
 Established Patient Office Visit  Subjective  Patient ID: Kara Scott, female    DOB: 02/05/1991  Age: 33 y.o. MRN: [id number]  Chief Complaint  Patient presents with   Medical Management of Chronic Issues    3 month    HPI Kara Scott 33 year old female present today for 3 months follow-up for chronic disease management and a new complaint of ear pain. Reports that he has been having trouble hearing and ear pain on anf off for the last 2-weeks  Cerumen impactation new problem 33 year old female presenting with a complaint of ear pain and decreased hearing. She reports that her hearing has been impaired due to cerumen (earwax) impaction. The pain is described as mild to moderate in intensity and is localized to the affected ear. The patient notes that the hearing loss is progressive and has been present for several days. She denies any trauma, drainage, or discharge from the ear. The patient is seeking evaluation and treatment for the cerumen impaction and associated symptoms.  Allergic Rhinitis: Kara Scott has a diagnosis of allergic rhinitis that is being managed with Zyrtec  10 mg daily. The condition has been effectively controlled with a daily dose of Zyrtec  (cetirizine ) 10 mg. The patient reports that their symptoms have been stable with this treatment regimen, and they have not experienced any significant flare-ups or changes in symptoms recently. The patient denies experiencing any new or worsening symptoms, such as sneezing, nasal congestion, itchy or watery eyes, or post-nasal drip, while on Zyrtec . There are no reports of side effects from the medication, and the patient has been adherent to the prescribed treatment.  Depression, Follow-up  She  was last seen for this 3 months ago. Changes made at last visit include none.   She reports excellent compliance with treatment. She is not having side effects.   She reports excellent tolerance of treatment. Current symptoms  include: difficulty concentrating She feels she is Improved since last visit.     12/20/2023   12:24 PM 08/31/2023    9:15 AM 05/31/2023    1:42 PM  Depression screen PHQ 2/9  Decreased Interest 2 2 3   Down, Depressed, Hopeless 1 1 2   PHQ - 2 Score 3 3 5   Altered sleeping 2 2 2   Tired, decreased energy 2 3 3   Change in appetite 1 2 3   Feeling bad or failure about yourself  0 0   Trouble concentrating 1 1 3   Moving slowly or fidgety/restless 1 1 2   Suicidal thoughts 0 0 0  PHQ-9 Score 10 12 18   Difficult doing work/chores Extremely dIfficult Very difficult Very difficult   Quit smoking 14-months ago   Patient Active Problem List   Diagnosis Date Noted   Bilateral impacted cerumen 12/20/2023   Photosensitivity 10/24/2023   Lymphadenopathy 10/24/2023   Acute diffuse otitis externa of both ears 08/31/2023   Encounter to establish care 05/31/2023   Routine general medical examination at health care facility 05/31/2023   Anxiety and depression 05/31/2023   Iron deficiency anemia secondary to inadequate dietary iron intake 05/31/2023   Need for Tdap vaccination 05/31/2023   Seasonal allergic rhinitis 05/31/2023   Moderate food insecurity 05/31/2023   Personal history of systemic lupus erythematosus (SLE) (HCC) 05/20/2023   Tobacco abuse counseling 05/20/2023   Chronic pain 02/12/2023   Past Medical History:  Diagnosis Date   Chronic pain    Lupus    Pain management    History reviewed. No pertinent surgical history.  Social History   Tobacco Use   Smoking status: Never    Passive exposure: Never   Smokeless tobacco: Never  Vaping Use   Vaping status: Never Used  Substance Use Topics   Alcohol use: No   Drug use: No   Social History   Socioeconomic History   Marital status: Single    Spouse name: Not on file   Number of children: Not on file   Years of education: Not on file   Highest education level: Some college, no degree  Occupational History   Not on file   Tobacco Use   Smoking status: Never    Passive exposure: Never   Smokeless tobacco: Never  Vaping Use   Vaping status: Never Used  Substance and Sexual Activity   Alcohol use: No   Drug use: No   Sexual activity: Yes    Birth control/protection: None  Other Topics Concern   Not on file  Social History Narrative   Not on file   Social Drivers of Health   Financial Resource Strain: High Risk (12/16/2023)   Overall Financial Resource Strain (CARDIA)    Difficulty of Paying Living Expenses: Hard  Food Insecurity: Food Insecurity Present (12/16/2023)   Hunger Vital Sign    Worried About Running Out of Food in the Last Year: Sometimes true    Ran Out of Food in the Last Year: Sometimes true  Transportation Needs: Unmet Transportation Needs (12/16/2023)   PRAPARE - Administrator, Civil Service (Medical): Yes    Lack of Transportation (Non-Medical): No  Physical Activity: Unknown (12/16/2023)   Exercise Vital Sign    Days of Exercise per Week: Patient declined    Minutes of Exercise per Session: Not on file  Stress: Stress Concern Present (12/16/2023)   [employer] of Occupational Health - Occupational Stress Questionnaire    Feeling of Stress : To some extent  Social Connections: Moderately Isolated (12/16/2023)   Social Connection and Isolation Panel [NHANES]    Frequency of Communication with Friends and Family: Three times a week    Frequency of Social Gatherings with Friends and Family: Twice a week    Attends Religious Services: 1 to 4 times per year    Active Member of [employer] or Organizations: No    Attends Engineer, Structural: Not on file    Marital Status: Never married  Intimate Partner Violence: Not At Risk (06/06/2023)   Humiliation, Afraid, Rape, and Kick questionnaire    Fear of Current or Ex-Partner: No    Emotionally Abused: No    Physically Abused: No    Sexually Abused: No   Family Status  Relation Name Status   Mother  Alive    Father  Alive   Brother  Alive   Brother  Alive   MGM  (Not Specified)  No partnership data on file   Family History  Problem Relation Age of Onset   Healthy Mother    Healthy Father    Diabetes Maternal Grandmother    No Known Allergies    Review of Systems  Constitutional:  Negative for chills and fever.  HENT:  Positive for ear pain. Negative for sore throat.   Respiratory:  Negative for cough, shortness of breath and wheezing.   Cardiovascular:  Negative for chest pain and leg swelling.  Gastrointestinal:  Negative for constipation, diarrhea, nausea and vomiting.  Skin:  Negative for itching and rash.  Neurological:  Negative for dizziness and headaches.  Psychiatric/Behavioral:  Negative for depression and suicidal ideas. The patient is not nervous/anxious.    Negative unless indicated in HPI   Objective:     BP 110/66   Pulse 76   Temp (!) 97.3 F (36.3 C) (Temporal)   Ht 5' 4 (1.626 m)   Wt 124 lb 3.2 oz (56.3 kg)   SpO2 96%   BMI 21.32 kg/m  BP Readings from Last 3 Encounters:  12/20/23 110/66  10/24/23 121/76  08/31/23 127/68   Wt Readings from Last 3 Encounters:  12/20/23 124 lb 3.2 oz (56.3 kg)  10/24/23 129 lb (58.5 kg)  08/31/23 126 lb 6.4 oz (57.3 kg)      Physical Exam HENT:     Head: Normocephalic and atraumatic.     Right Ear: There is impacted cerumen.     Left Ear: There is impacted cerumen.     Ears:     Comments: Tympanic membrane visible postprocedure    Nose: Nose normal.     Mouth/Throat:     Mouth: Mucous membranes are moist.  Eyes:     General: No scleral icterus.    Extraocular Movements: Extraocular movements intact.     Conjunctiva/sclera: Conjunctivae normal.     Pupils: Pupils are equal, round, and reactive to light.  Cardiovascular:     Rate and Rhythm: Normal rate and regular rhythm.  Pulmonary:     Effort: Pulmonary effort is normal.     Breath sounds: Normal breath sounds.  Abdominal:     General: Bowel sounds  are normal.     Palpations: Abdomen is soft.  Musculoskeletal:        General: Normal range of motion.     Right lower leg: No edema.     Left lower leg: No edema.  Skin:    General: Skin is warm and dry.     Findings: No rash.  Neurological:     Mental Status: She is oriented to person, place, and time. Mental status is at baseline.  Psychiatric:        Attention and Perception: Attention and perception normal.        Mood and Affect: Mood and affect normal.        Speech: Speech normal.        Behavior: Behavior normal. Behavior is cooperative.        Thought Content: Thought content normal.        Cognition and Memory: Cognition and memory normal.        Judgment: Judgment normal.      No results found for any visits on 12/20/23.  Last CBC Lab Results  Component Value Date   WBC 8.8 10/24/2023   HGB 12.0 10/24/2023   HCT 36.6 10/24/2023   MCV 82.4 10/24/2023   MCH 27.0 10/24/2023   RDW 12.6 10/24/2023   PLT 352 10/24/2023   Last metabolic panel Lab Results  Component Value Date   GLUCOSE 78 05/31/2023   NA 137 05/31/2023   K 4.0 05/31/2023   CL 103 05/31/2023   CO2 21 05/31/2023   BUN 16 05/31/2023   CREATININE 0.62 05/31/2023   EGFR 120 05/31/2023   CALCIUM 9.1 05/31/2023   PROT 7.1 05/31/2023   ALBUMIN 4.6 05/31/2023   LABGLOB 2.5 05/31/2023   BILITOT <0.2 05/31/2023   ALKPHOS 63 05/31/2023   AST 25 05/31/2023   ALT 18 05/31/2023   ANIONGAP 7 02/12/2023   Last lipids Lab Results  Component Value Date  CHOL 161 05/31/2023   HDL 55 05/31/2023   LDLCALC 87 05/31/2023   TRIG 103 05/31/2023   CHOLHDL 2.9 05/31/2023   Last hemoglobin A1c Lab Results  Component Value Date   HGBA1C 5.0 05/31/2023   Last thyroid  functions Lab Results  Component Value Date   TSH 1.990 05/31/2023   T4TOTAL 6.1 05/31/2023        Assessment & Plan:  Bilateral impacted cerumen  Seasonal allergic rhinitis, unspecified trigger  Anxiety and depression -      Escitalopram  Oxalate; Take 1 tablet (10 mg total) by mouth daily.  Dispense: 90 tablet; Refill: 1  Other chronic pain -     Ibuprofen ; Take 1 tablet (800 mg total) by mouth every 8 (eight) hours as needed.  Dispense: 90 tablet; Refill: 1  Personal history of systemic lupus erythematosus (SLE) (HCC)  Other orders -     Debrox; Place 5 drops into both ears 2 (two) times daily.  Dispense: 15 mL; Refill: 3  Kara Scott is a 33 year old, African-American female seen today for chronic disease management, no acute distress  Anxiety and depression: Continue Lexapro  10 mg daily refill provided  Allergic rhinitis: continue Zyrtec  10 mg daily, refill provided  Systemic lupus: Client to continue follow-up with rheumatology as all already scheduled  impacted cerumen: debrox otic drops BID PRN Ear Cerumen Removal  Date/Time: 12/20/2023 12:53 PM  Performed by: Deitra Morton Sebastian Nena, NP Authorized by: Deitra Morton Sebastian Nena, NP   Anesthesia: Local Anesthetic: none Location: bilateral ears. Patient tolerance: patient tolerated the procedure well with no immediate complications Comments: Post procedure, she reports that hearing has improved Procedure type: irrigation (both curette and irragations used)  Sedation: Patient sedated: no      Encourage healthy lifestyle choices, including diet (rich in fruits, vegetables, and lean proteins, and low in salt and simple carbohydrates) and exercise (at least 30 minutes of moderate physical activity daily).     The above assessment and management plan was discussed with the patient. The patient verbalized understanding of and has agreed to the management plan. Patient is aware to call the clinic if they develop any new symptoms or if symptoms persist or worsen. Patient is aware when to return to the clinic for a follow-up visit. Patient educated on when it is appropriate to go to the emergency department.  Return in about 4 months (around 04/18/2024).     Alexandar Weisenberger [place] Thompson, DNP [facility] [address] [place], [place] [address] [phone]    Note: This document was prepared by [employer] voice dictation technology and any errors that results from this process are unintentional.

## 2023-12-20 ENCOUNTER — Ambulatory Visit (INDEPENDENT_AMBULATORY_CARE_PROVIDER_SITE_OTHER): Payer: Medicaid Other | Admitting: Nurse Practitioner

## 2023-12-20 ENCOUNTER — Encounter: Payer: Self-pay | Admitting: Nurse Practitioner

## 2023-12-20 VITALS — BP 110/66 | HR 76 | Temp 97.3°F | Ht 64.0 in | Wt 124.2 lb

## 2023-12-20 DIAGNOSIS — M329 Systemic lupus erythematosus, unspecified: Secondary | ICD-10-CM

## 2023-12-20 DIAGNOSIS — F32A Depression, unspecified: Secondary | ICD-10-CM

## 2023-12-20 DIAGNOSIS — F419 Anxiety disorder, unspecified: Secondary | ICD-10-CM | POA: Diagnosis not present

## 2023-12-20 DIAGNOSIS — G8929 Other chronic pain: Secondary | ICD-10-CM

## 2023-12-20 DIAGNOSIS — J302 Other seasonal allergic rhinitis: Secondary | ICD-10-CM | POA: Diagnosis not present

## 2023-12-20 DIAGNOSIS — H6123 Impacted cerumen, bilateral: Secondary | ICD-10-CM | POA: Diagnosis not present

## 2023-12-20 MED ORDER — DEBROX 6.5 % OT SOLN: 5.0000 [drp] | Freq: Two times a day (BID) | OTIC | 3 refills | Status: DC

## 2023-12-20 MED ORDER — IBUPROFEN 800 MG PO TABS: 800.0000 mg | ORAL_TABLET | Freq: Three times a day (TID) | ORAL | 1 refills | Status: DC | PRN

## 2023-12-20 MED ORDER — ESCITALOPRAM OXALATE 10 MG PO TABS: 10.0000 mg | ORAL_TABLET | Freq: Every day | ORAL | 1 refills | Status: DC

## 2024-04-11 ENCOUNTER — Encounter (HOSPITAL_COMMUNITY): Payer: Self-pay

## 2024-04-18 ENCOUNTER — Encounter: Payer: Self-pay | Admitting: Nurse Practitioner

## 2024-04-18 ENCOUNTER — Ambulatory Visit: Payer: Medicaid Other | Admitting: Nurse Practitioner

## 2024-04-18 VITALS — BP 131/83 | HR 70 | Temp 97.8°F | Ht 64.0 in | Wt 132.0 lb

## 2024-04-18 DIAGNOSIS — M329 Systemic lupus erythematosus, unspecified: Secondary | ICD-10-CM | POA: Diagnosis not present

## 2024-04-18 DIAGNOSIS — F419 Anxiety disorder, unspecified: Secondary | ICD-10-CM

## 2024-04-18 DIAGNOSIS — G8929 Other chronic pain: Secondary | ICD-10-CM

## 2024-04-18 DIAGNOSIS — F32A Depression, unspecified: Secondary | ICD-10-CM

## 2024-04-18 MED ORDER — IBUPROFEN 800 MG PO TABS: 800.0000 mg | ORAL_TABLET | Freq: Three times a day (TID) | ORAL | 1 refills | Status: DC | PRN

## 2024-04-18 MED ORDER — ESCITALOPRAM OXALATE 10 MG PO TABS: 10.0000 mg | ORAL_TABLET | Freq: Every day | ORAL | 1 refills | Status: DC

## 2024-04-18 NOTE — Progress Notes (Addendum)
 Established Patient Office Visit  Subjective  Patient ID: Kara Scott, female    DOB: 05/11/1991  Age: 33 y.o. MRN: [id number]  Chief Complaint  Patient presents with   Medical Management of Chronic Issues    4 month     HPI Kara Scott is 33 yrs old female presents for 4 months follow up fro chronic diseases management, no acute distress  Anxiety, Follow-up  She was last seen for anxiety 4 months ago. Changes made at last visit include continue Lexapro  10 mg daily.   She reports excellent compliance with treatment. She reports excellent tolerance of treatment. She is not having side effects.  She feels her anxiety is mild and Improved since last visit.  Symptoms: No chest pain No difficulty concentrating  No dizziness No fatigue  No feelings of losing control No insomnia  No irritable No palpitations  No panic attacks No racing thoughts  No shortness of breath No sweating  No tremors/shakes    GAD-7 Results    12/20/2023   12:24 PM 08/31/2023    9:16 AM 05/31/2023    1:43 PM  GAD-7 Generalized Anxiety Disorder Screening Tool  1. Feeling Nervous, Anxious, or on Edge 1 3 3   2. Not Being Able to Stop or Control Worrying 1 1 3   3. Worrying Too Much About Different Things 0 2 3  4. Trouble Relaxing 1 2 3   5. Being So Restless it's Hard To Sit Still 1 2 3   6. Becoming Easily Annoyed or Irritable 1 1 3   7. Feeling Afraid As If Something Awful Might Happen 0 2 2  Total GAD-7 Score 5 13 20   Difficulty At Work, Home, or Getting  Along With Others? Somewhat difficult Very difficult     PHQ-9 Scores    12/20/2023   12:24 PM 08/31/2023    9:15 AM 05/31/2023    1:42 PM  PHQ9 SCORE ONLY  PHQ-9 Total Score 10 12 18    Depression, Follow-up She  was last seen for this 4 months ago. Changes made at last visit include Lexapro  10 mg daily. She reports excellent compliance with treatment. She is not having side effects.  She reports excellent tolerance of treatment. Current  symptoms include: fatigue She feels she is Improved since last visit.     12/20/2023   12:24 PM 08/31/2023    9:15 AM 05/31/2023    1:42 PM  Depression screen PHQ 2/9  Decreased Interest 2 2 3   Down, Depressed, Hopeless 1 1 2   PHQ - 2 Score 3 3 5   Altered sleeping 2 2 2   Tired, decreased energy 2 3 3   Change in appetite 1 2 3   Feeling bad or failure about yourself  0 0   Trouble concentrating 1 1 3   Moving slowly or fidgety/restless 1 1 2   Suicidal thoughts 0 0 0  PHQ-9 Score 10 12 18   Difficult doing work/chores Extremely dIfficult Very difficult Very difficult  SLE The patient is a 33 year old female with a known history of systemic lupus erythematosus (SLE), currently under the care of a rheumatologist. She denies any recent lupus flare-ups, new joint pain, rashes, fatigue, or other systemic symptoms. She reports stable disease control with her current treatment regimen and has been compliant with follow-up appointments and medications.   Patient Active Problem List   Diagnosis Date Noted   Bilateral impacted cerumen 12/20/2023   Photosensitivity 10/24/2023   Lymphadenopathy 10/24/2023   Acute diffuse otitis externa of both ears  08/31/2023   Encounter to establish care 05/31/2023   Routine general medical examination at health care facility 05/31/2023   Anxiety and depression 05/31/2023   Iron deficiency anemia secondary to inadequate dietary iron intake 05/31/2023   Need for Tdap vaccination 05/31/2023   Seasonal allergic rhinitis 05/31/2023   Moderate food insecurity 05/31/2023   Personal history of systemic lupus erythematosus (SLE) (HCC) 05/20/2023   Chronic pain 02/12/2023   Past Medical History:  Diagnosis Date   Chronic pain    Lupus    Pain management    History reviewed. No pertinent surgical history. Social History   Tobacco Use   Smoking status: Never    Passive exposure: Never   Smokeless tobacco: Never  Vaping Use   Vaping status: Never Used   Substance Use Topics   Alcohol use: No   Drug use: No   Social History   Socioeconomic History   Marital status: Single    Spouse name: Not on file   Number of children: Not on file   Years of education: Not on file   Highest education level: Some college, no degree  Occupational History   Not on file  Tobacco Use   Smoking status: Never    Passive exposure: Never   Smokeless tobacco: Never  Vaping Use   Vaping status: Never Used  Substance and Sexual Activity   Alcohol use: No   Drug use: No   Sexual activity: Yes    Birth control/protection: None  Other Topics Concern   Not on file  Social History Narrative   Not on file   Social Drivers of Health   Financial Resource Strain: High Risk (12/16/2023)   Overall Financial Resource Strain (CARDIA)    Difficulty of Paying Living Expenses: Hard  Food Insecurity: Food Insecurity Present (12/16/2023)   Hunger Vital Sign    Worried About Running Out of Food in the Last Year: Sometimes true    Ran Out of Food in the Last Year: Sometimes true  Transportation Needs: Unmet Transportation Needs (12/16/2023)   PRAPARE - Administrator, Civil Service (Medical): Yes    Lack of Transportation (Non-Medical): No  Physical Activity: Unknown (12/16/2023)   Exercise Vital Sign    Days of Exercise per Week: Patient declined    Minutes of Exercise per Session: Not on file  Stress: Stress Concern Present (12/16/2023)   [employer] of Occupational Health - Occupational Stress Questionnaire    Feeling of Stress : To some extent  Social Connections: Moderately Isolated (12/16/2023)   Social Connection and Isolation Panel [NHANES]    Frequency of Communication with Friends and Family: Three times a week    Frequency of Social Gatherings with Friends and Family: Twice a week    Attends Religious Services: 1 to 4 times per year    Active Member of [employer] or Organizations: No    Attends Engineer, structural: Not on file     Marital Status: Never married  Intimate Partner Violence: Not At Risk (06/06/2023)   Humiliation, Afraid, Rape, and Kick questionnaire    Fear of Current or Ex-Partner: No    Emotionally Abused: No    Physically Abused: No    Sexually Abused: No   Family Status  Relation Name Status   Mother  Alive   Father  Alive   Brother  Alive   Brother  Alive   MGM  (Not Specified)  No partnership data on file   Family History  Problem Relation Age of Onset   Healthy Mother    Healthy Father    Diabetes Maternal Grandmother    No Known Allergies    Review of Systems  Constitutional:  Negative for chills and fever.  HENT:  Negative for ear pain and sore throat.   Respiratory:  Negative for cough and shortness of breath.   Cardiovascular:  Negative for chest pain and leg swelling.  Gastrointestinal:  Negative for constipation, diarrhea, nausea and vomiting.  Genitourinary:  Negative for dysuria and hematuria.  Skin:  Negative for itching and rash.  Neurological:  Negative for dizziness and headaches.   Negative unless indicated in HPI   Objective:     BP 131/83   Pulse 70   Temp 97.8 F (36.6 C) (Temporal)   Ht 5\' 4"  (1.626 m)   Wt 132 lb (59.9 kg)   SpO2 99%   BMI 22.66 kg/m  BP Readings from Last 3 Encounters:  04/18/24 131/83  12/20/23 110/66  10/24/23 121/76   Wt Readings from Last 3 Encounters:  04/18/24 132 lb (59.9 kg)  12/20/23 124 lb 3.2 oz (56.3 kg)  10/24/23 129 lb (58.5 kg)      Physical Exam Vitals and nursing note reviewed.  Constitutional:      General: She is not in acute distress.    Appearance: Normal appearance.  HENT:     Head: Normocephalic and atraumatic.     Nose: Nose normal.     Mouth/Throat:     Mouth: Mucous membranes are moist.  Eyes:     Extraocular Movements: Extraocular movements intact.     Conjunctiva/sclera: Conjunctivae normal.     Pupils: Pupils are equal, round, and reactive to light.  Pulmonary:     Effort:  Pulmonary effort is normal.     Breath sounds: Normal breath sounds.  Musculoskeletal:        General: Normal range of motion.     Right lower leg: No edema.     Left lower leg: No edema.  Skin:    General: Skin is warm and dry.  Neurological:     Mental Status: She is alert and oriented to person, place, and time.  Psychiatric:        Attention and Perception: Attention and perception normal.        Mood and Affect: Mood normal.        Speech: Speech normal.        Behavior: Behavior is uncooperative.        Thought Content: Thought content normal. Thought content does not include suicidal ideation. Thought content does not include suicidal plan.        Cognition and Memory: Cognition and memory normal.        Judgment: Judgment normal.      No results found for any visits on 04/18/24.  Last CBC Lab Results  Component Value Date   WBC 8.8 10/24/2023   HGB 12.0 10/24/2023   HCT 36.6 10/24/2023   MCV 82.4 10/24/2023   MCH 27.0 10/24/2023   RDW 12.6 10/24/2023   PLT 352 10/24/2023   Last metabolic panel Lab Results  Component Value Date   GLUCOSE 78 05/31/2023   NA 137 05/31/2023   K 4.0 05/31/2023   CL 103 05/31/2023   CO2 21 05/31/2023   BUN 16 05/31/2023   CREATININE 0.62 05/31/2023   EGFR 120 05/31/2023   CALCIUM 9.1 05/31/2023   PROT 7.1 05/31/2023   ALBUMIN 4.6 05/31/2023  LABGLOB 2.5 05/31/2023   BILITOT <0.2 05/31/2023   ALKPHOS 63 05/31/2023   AST 25 05/31/2023   ALT 18 05/31/2023   ANIONGAP 7 02/12/2023   Last lipids Lab Results  Component Value Date   CHOL 161 05/31/2023   HDL 55 05/31/2023   LDLCALC 87 05/31/2023   TRIG 103 05/31/2023   CHOLHDL 2.9 05/31/2023   Last hemoglobin A1c Lab Results  Component Value Date   HGBA1C 5.0 05/31/2023   Last thyroid  functions Lab Results  Component Value Date   TSH 1.990 05/31/2023   T4TOTAL 6.1 05/31/2023        Assessment & Plan:  Personal history of systemic lupus erythematosus (SLE)  (HCC)  Anxiety and depression -     Escitalopram  Oxalate; Take 1 tablet (10 mg total) by mouth daily.  Dispense: 90 tablet; Refill: 1  Other chronic pain -     Ibuprofen ; Take 1 tablet (800 mg total) by mouth every 8 (eight) hours as needed.  Dispense: 90 tablet; Refill: 1  Sharyl is 33 yrs old [place] female, seen for chronic diseases management, no acyue distress SLE: continue follow up with Rheumatology Anxiety and depression: continue Lexapro  10 mg , refill provided  Encourage healthy lifestyle choices, including diet (rich in fruits, vegetables, and lean proteins, and low in salt and simple carbohydrates) and exercise (at least 30 minutes of moderate physical activity daily).     The above assessment and management plan was discussed with the patient. The patient verbalized understanding of and has agreed to the management plan. Patient is aware to call the clinic if they develop any new symptoms or if symptoms persist or worsen. Patient is aware when to return to the clinic for a follow-up visit. Patient educated on when it is appropriate to go to the emergency department.  Return in about 6 months (around 10/19/2024) for chronic Diseases management.    Continue healthy lifestyle choices, including diet (rich in fruits, vegetables, and lean proteins, and low in salt and simple carbohydrates) and exercise (at least 30 minutes of moderate physical activity daily).     The above assessment and management plan was discussed with the patient. The patient verbalized understanding of and has agreed to the management plan. Patient is aware to call the clinic if they develop any new symptoms or if symptoms persist or worsen. Patient is aware when to return to the clinic for a follow-up visit. Patient educated on when it is appropriate to go to the emergency department.     Note: This document was prepared by [employer] voice dictation technology and any errors that results from this process  are unintentional.

## 2024-06-22 ENCOUNTER — Other Ambulatory Visit: Payer: Self-pay | Admitting: Nurse Practitioner

## 2024-06-22 DIAGNOSIS — J302 Other seasonal allergic rhinitis: Secondary | ICD-10-CM

## 2024-10-23 ENCOUNTER — Encounter: Payer: Self-pay | Admitting: Nurse Practitioner

## 2024-10-23 ENCOUNTER — Ambulatory Visit (INDEPENDENT_AMBULATORY_CARE_PROVIDER_SITE_OTHER): Payer: Self-pay | Admitting: Nurse Practitioner

## 2024-10-23 VITALS — BP 138/82 | HR 88 | Temp 97.1°F | Ht 64.0 in | Wt 125.6 lb

## 2024-10-23 DIAGNOSIS — F331 Major depressive disorder, recurrent, moderate: Secondary | ICD-10-CM

## 2024-10-23 DIAGNOSIS — J302 Other seasonal allergic rhinitis: Secondary | ICD-10-CM

## 2024-10-23 DIAGNOSIS — D508 Other iron deficiency anemias: Secondary | ICD-10-CM | POA: Diagnosis not present

## 2024-10-23 DIAGNOSIS — R799 Abnormal finding of blood chemistry, unspecified: Secondary | ICD-10-CM

## 2024-10-23 DIAGNOSIS — F419 Anxiety disorder, unspecified: Secondary | ICD-10-CM | POA: Diagnosis not present

## 2024-10-23 LAB — LIPID PANEL

## 2024-10-23 MED ORDER — ESCITALOPRAM OXALATE 10 MG PO TABS: 10.0000 mg | ORAL_TABLET | Freq: Every day | ORAL | 1 refills | Status: AC

## 2024-10-23 MED ORDER — ESCITALOPRAM OXALATE 5 MG PO TABS: 5.0000 mg | ORAL_TABLET | Freq: Every day | ORAL | 1 refills | Status: AC

## 2024-10-23 MED ORDER — DEBROX 6.5 % OT SOLN: 5.0000 [drp] | Freq: Two times a day (BID) | OTIC | 3 refills | Status: AC

## 2024-10-23 MED ORDER — CETIRIZINE HCL 10 MG PO TABS: 10.0000 mg | ORAL_TABLET | Freq: Every day | ORAL | 1 refills | Status: AC

## 2024-10-23 NOTE — Progress Notes (Signed)
 Subjective:  Patient ID: Kara Scott, female    DOB: Aug 13, 1991, 33 y.o.   MRN: [id number]  Patient Care Team: Deitra Morton Sebastian Nena, NP as PCP - General (Nurse Practitioner)   Chief Complaint:  Medical Management of Chronic Issues (6 month)   HPI: Kara Scott is a 33 y.o. female presenting on 10/23/2024 for Medical Management of Chronic Issues (6 month)   Discussed the use of AI scribe software for clinical note transcription with the patient, who gave verbal consent to proceed.  History of Present Illness Kara Scott is a 33 year old female who presents for medication refills and follow-up on anxiety and depression management.  She is currently taking Lexapro  for anxiety, and her anxiety scale score has decreased from thirteen to five since her last visit. Her depression screening score has also decreased from twelve to ten over the past year, and she feels much better, noticing a significant difference in her symptoms.  She is also taking medication for cerumen build up, which she is about to run out of. She is concerned about not running out of this medication.  She has hx of allergic rhinitis currently being managed with Zyrtec  10 mg daily which she reports great advice and no side effects       12/20/2023   12:24 PM 08/31/2023    9:15 AM 05/31/2023    1:42 PM  PHQ9 SCORE ONLY  PHQ-9 Total Score 10  12  18       Data saved with a previous flowsheet row definition       12/20/2023   12:24 PM 08/31/2023    9:16 AM 05/31/2023    1:43 PM  GAD 7 : Generalized Anxiety Score  Nervous, Anxious, on Edge 1 3 3   Control/stop worrying 1 1 3   Worry too much - different things 0 2 3  Trouble relaxing 1 2 3   Restless 1 2 3   Easily annoyed or irritable 1 1 3   Afraid - awful might happen 0 2 2  Total GAD 7 Score 5 13 20   Anxiety Difficulty Somewhat difficult Very difficult        Relevant past medical, surgical, family, and social history reviewed and updated as  indicated.  Allergies and medications reviewed and updated. Data reviewed: Chart in Epic.   Past Medical History:  Diagnosis Date   Chronic pain    Lupus    Pain management     History reviewed. No pertinent surgical history.  Social History   Socioeconomic History   Marital status: Single    Spouse name: Not on file   Number of children: Not on file   Years of education: Not on file   Highest education level: Some college, no degree  Occupational History   Not on file  Tobacco Use   Smoking status: Never    Passive exposure: Never   Smokeless tobacco: Never  Vaping Use   Vaping status: Never Used  Substance and Sexual Activity   Alcohol use: No   Drug use: No   Sexual activity: Yes    Birth control/protection: None  Other Topics Concern   Not on file  Social History Narrative   Not on file   Social Drivers of Health   Financial Resource Strain: High Risk (10/22/2024)   Overall Financial Resource Strain (CARDIA)    Difficulty of Paying Living Expenses: Very hard  Food Insecurity: Food Insecurity Present (10/22/2024)   Hunger Vital Sign  Worried About Programme Researcher, Broadcasting/film/video in the Last Year: Often true    [employer] of Food in the Last Year: Often true  Transportation Needs: No Transportation Needs (10/22/2024)   PRAPARE - Administrator, Civil Service (Medical): No    Lack of Transportation (Non-Medical): No  Physical Activity: Unknown (12/16/2023)   Exercise Vital Sign    Days of Exercise per Week: Patient declined    Minutes of Exercise per Session: Not on file  Stress: Stress Concern Present (12/16/2023)   [employer] of Occupational Health - Occupational Stress Questionnaire    Feeling of Stress : To some extent  Social Connections: Moderately Isolated (10/22/2024)   Social Connection and Isolation Panel    Frequency of Communication with Friends and Family: Three times a week    Frequency of Social Gatherings with Friends and Family: Twice  a week    Attends Religious Services: More than 4 times per year    Active Member of [employer] or Organizations: No    Attends Engineer, Structural: Not on file    Marital Status: Never married  Intimate Partner Violence: Not At Risk (06/06/2023)   Humiliation, Afraid, Rape, and Kick questionnaire    Fear of Current or Ex-Partner: No    Emotionally Abused: No    Physically Abused: No    Sexually Abused: No    Outpatient Encounter Medications as of 10/23/2024  Medication Sig   Buprenorphine HCl-Naloxone HCl 8-2 MG FILM Take by mouth 2 (two) times daily.   Elastic Bandages & Supports (FUTURO REVERSIBLE WRIST BRACE) MISC Left wrist brace.   escitalopram  (LEXAPRO ) 5 MG tablet Take 1 tablet (5 mg total) by mouth daily. Take with 10 mg for a total 15 mg daily   [DISCONTINUED] carbamide peroxide (DEBROX) 6.5 % OTIC solution Place 5 drops into both ears 2 (two) times daily.   [DISCONTINUED] cetirizine  (ZYRTEC ) 10 MG tablet TAKE ONE TABLET DAILY   [DISCONTINUED] escitalopram  (LEXAPRO ) 10 MG tablet Take 1 tablet (10 mg total) by mouth daily.   [DISCONTINUED] ibuprofen  (ADVIL ) 800 MG tablet Take 1 tablet (800 mg total) by mouth every 8 (eight) hours as needed.   [DISCONTINUED] ofloxacin  (FLOXIN ) 0.3 % OTIC solution Place 5 drops into both ears daily.   carbamide peroxide (DEBROX) 6.5 % OTIC solution Place 5 drops into both ears 2 (two) times daily.   cetirizine  (ZYRTEC ) 10 MG tablet Take 1 tablet (10 mg total) by mouth daily.   escitalopram  (LEXAPRO ) 10 MG tablet Take 1 tablet (10 mg total) by mouth daily. Take with 5 mg for a total 15 mg daily   No facility-administered encounter medications on file as of 10/23/2024.    No Known Allergies  Pertinent ROS per HPI, otherwise unremarkable      Objective:  BP 138/82   Pulse 88   Temp (!) 97.1 F (36.2 C) (Temporal)   Ht 5' 4 (1.626 m)   Wt 125 lb 9.6 oz (57 kg)   SpO2 99%   BMI 21.56 kg/m    Wt Readings from Last 3 Encounters:   10/23/24 125 lb 9.6 oz (57 kg)  04/18/24 132 lb (59.9 kg)  12/20/23 124 lb 3.2 oz (56.3 kg)    Physical Exam Vitals and nursing note reviewed.  Constitutional:      General: She is not in acute distress.    Appearance: Normal appearance.  HENT:     Head: Normocephalic and atraumatic.     Nose: Nose  normal.     Mouth/Throat:     Mouth: Mucous membranes are moist.  Eyes:     General: No scleral icterus.    Extraocular Movements: Extraocular movements intact.     Conjunctiva/sclera: Conjunctivae normal.     Pupils: Pupils are equal, round, and reactive to light.  Cardiovascular:     Heart sounds: Normal heart sounds.  Pulmonary:     Effort: Pulmonary effort is normal.     Breath sounds: Normal breath sounds.  Musculoskeletal:        General: Normal range of motion.     Right lower leg: No edema.     Left lower leg: No edema.  Skin:    General: Skin is warm and dry.     Findings: No rash.  Neurological:     Mental Status: She is alert and oriented to person, place, and time.  Psychiatric:        Attention and Perception: Attention and perception normal.        Mood and Affect: Mood and affect normal.        Speech: Speech normal.        Behavior: Behavior normal. Behavior is cooperative.        Thought Content: Thought content normal. Thought content does not include homicidal or suicidal ideation. Thought content does not include homicidal or suicidal plan.        Cognition and Memory: Cognition and memory normal.        Judgment: Judgment normal.    Physical Exam      Results for orders placed or performed in visit on 10/24/23  Sedimentation rate   Collection Time: 10/24/23 10:02 AM  Result Value Ref Range   Sed Rate 6 0 - 20 mm/h  ANA   Collection Time: 10/24/23 10:02 AM  Result Value Ref Range   Anti Nuclear Antibody (ANA) POSITIVE (A) NEGATIVE  RNP Antibody   Collection Time: 10/24/23 10:02 AM  Result Value Ref Range   Ribonucleic Protein(ENA) Antibody,  IgG <1.0 NEG <1.0 NEG AI  Anti-Smith antibody   Collection Time: 10/24/23 10:02 AM  Result Value Ref Range   ENA SM Ab Ser-aCnc <1.0 NEG <1.0 NEG AI  Sjogrens syndrome-A extractable nuclear antibody   Collection Time: 10/24/23 10:02 AM  Result Value Ref Range   SSA (Ro) (ENA) Antibody, IgG <1.0 NEG <1.0 NEG AI  Sjogrens syndrome-B extractable nuclear antibody   Collection Time: 10/24/23 10:02 AM  Result Value Ref Range   SSB (La) (ENA) Antibody, IgG <1.0 NEG <1.0 NEG AI  Anti-DNA antibody, double-stranded   Collection Time: 10/24/23 10:02 AM  Result Value Ref Range   ds DNA Ab 2 IU/mL  C3 and C4   Collection Time: 10/24/23 10:02 AM  Result Value Ref Range   C3 Complement 115 83 - 193 mg/dL   C4 Complement 24 15 - 57 mg/dL  Protein / creatinine ratio, urine   Collection Time: 10/24/23 10:02 AM  Result Value Ref Range   Creatinine, Urine 73 20 - 275 mg/dL   Protein/Creat Ratio 82 24 - 184 mg/g creat   Protein/Creatinine Ratio 0.082 0.024 - 0.184 mg/mg creat   Total Protein, Urine 6 5 - 24 mg/dL  CBC with Differential/Platelet   Collection Time: 10/24/23 10:02 AM  Result Value Ref Range   WBC 8.8 3.8 - 10.8 Thousand/uL   RBC 4.44 3.80 - 5.10 Million/uL   Hemoglobin 12.0 11.7 - 15.5 g/dL   HCT 63.3 64.9 - 54.9 %  MCV 82.4 80.0 - 100.0 fL   MCH 27.0 27.0 - 33.0 pg   MCHC 32.8 32.0 - 36.0 g/dL   RDW 87.3 88.9 - 84.9 %   Platelets 352 140 - 400 Thousand/uL   MPV 9.9 7.5 - 12.5 fL   Neutro Abs 5,896 1,500 - 7,800 cells/uL   Absolute Lymphocytes 2,121 850 - 3,900 cells/uL   Absolute Monocytes 431 200 - 950 cells/uL   Eosinophils Absolute 308 15 - 500 cells/uL   Basophils Absolute 44 0 - 200 cells/uL   Neutrophils Relative % 67 %   Total Lymphocyte 24.1 %   Monocytes Relative 4.9 %   Eosinophils Relative 3.5 %   Basophils Relative 0.5 %  Anti-nuclear ab-titer (ANA titer)   Collection Time: 10/24/23 10:02 AM  Result Value Ref Range   ANA Titer 1 1:80 (H) titer   ANA  Pattern 1 Nuclear, Fine Speckled (A)        Pertinent labs & imaging results that were available during my care of the patient were reviewed by me and considered in my medical decision making.  Assessment & Plan:  Kara Scott was seen today for medical management of chronic issues.  Diagnoses and all orders for this visit:  Iron deficiency anemia secondary to inadequate dietary iron intake -     CBC with Differential/Platelet  Anxiety and depression -     escitalopram  (LEXAPRO ) 10 MG tablet; Take 1 tablet (10 mg total) by mouth daily. Take with 5 mg for a total 15 mg daily -     escitalopram  (LEXAPRO ) 5 MG tablet; Take 1 tablet (5 mg total) by mouth daily. Take with 10 mg for a total 15 mg daily  Seasonal allergic rhinitis, unspecified trigger -     cetirizine  (ZYRTEC ) 10 MG tablet; Take 1 tablet (10 mg total) by mouth daily.  Moderate episode of recurrent major depressive disorder (HCC) -     HepB+HepC+HIV Panel -     escitalopram  (LEXAPRO ) 10 MG tablet; Take 1 tablet (10 mg total) by mouth daily. Take with 5 mg for a total 15 mg daily -     escitalopram  (LEXAPRO ) 5 MG tablet; Take 1 tablet (5 mg total) by mouth daily. Take with 10 mg for a total 15 mg daily -     CBC with Differential/Platelet -     Thyroid  Panel With TSH -     Lipid panel  Elevated BUN -     CMP14+EGFR  Other orders -     carbamide peroxide (DEBROX) 6.5 % OTIC solution; Place 5 drops into both ears 2 (two) times daily.     Assessment and Plan African-American female seen today for chronic disease management, no acute distress Assessment & Plan Anxiety disorder Anxiety symptoms improved with Lexapro ; dosage increase warranted. - Increased Lexapro  to 15 mg daily, taking one 5 mg tablet and one 10 mg tablet.  Depression Depression symptoms improved with Lexapro ; dosage increase warranted. - Increased Lexapro  to 15 mg daily, taking one 5 mg tablet and one 10 mg tablet.  Seasonal allergic rhinitis Requires  ongoing management with allergy medication. - Continue Zyrtec  10 mg daily.  Bilateral impacted cerumen Requires ongoing management with Debrox drops. - Continue Debrox drops for ear.  Iron deficiency anemia Previous labs showed low iron saturation; reassessment needed. - Ordered basic lab tests including CMP, CBC, and thyroid  function tests.  General Health Maintenance Routine health maintenance necessary. - Ordered basic lab tests including CMP, CBC, and thyroid  function  tests.   Health maintenance: Hep C, hep B, HIV ordered result pending  Continue all other maintenance medications.  Follow up plan: Return in about 6 months (around 04/22/2025) for Chronic Diseases Management.   Continue healthy lifestyle choices, including diet (rich in fruits, vegetables, and lean proteins, and low in salt and simple carbohydrates) and exercise (at least 30 minutes of moderate physical activity daily).  Educational handout given for    Clinical References  Managing Anxiety, Adult After being diagnosed with anxiety, you may be relieved to know why you have felt or behaved a certain way. You may also feel overwhelmed about the treatment ahead and what it will mean for your life. With care and support, you can manage your anxiety. How to manage lifestyle changes Understanding the difference between stress and anxiety Although stress can play a role in anxiety, it is not the same as anxiety. Stress is your body's reaction to life changes and events, both good and bad. Stress is often caused by something external, such as a deadline, test, or competition. It normally goes away after the event has ended and will last just a few hours. But, stress can be ongoing and can lead to more than just stress. Anxiety is caused by something internal, such as imagining a terrible outcome or worrying that something will go wrong that will greatly upset you. Anxiety often does not go away even after the event is  over, and it can become a long-term (chronic) worry. Lowering stress and anxiety Talk with your health care provider or a counselor to learn more about lowering anxiety and stress. They may suggest tension-reduction techniques, such as: Music. Spend time creating or listening to music that you enjoy and that inspires you. Mindfulness-based meditation. Practice being aware of your normal breaths while not trying to control your breathing. It can be done while sitting or walking. Centering prayer. Focus on a word, phrase, or sacred image that means something to you and brings you peace. Deep breathing. Expand your stomach and inhale slowly through your nose. Hold your breath for 3-5 seconds. Then breathe out slowly, letting your stomach muscles relax. Self-talk. Learn to notice and spot thought patterns that lead to anxiety reactions. Change those patterns to thoughts that feel peaceful. Muscle relaxation. Take time to tense muscles and then relax them. Choose a tension-reduction technique that fits your lifestyle and personality. These techniques take time and practice. Set aside 5-15 minutes a day to do them. Specialized therapists can offer counseling and training in these techniques. The training to help with anxiety may be covered by some insurance plans. Other things you can do to manage stress and anxiety include: Keeping a stress diary. This can help you learn what triggers your reaction and then learn ways to manage your response. Thinking about how you react to certain situations. You may not be able to control everything, but you can control your response. Making time for activities that help you relax and not feeling guilty about spending your time in this way. Doing visual imagery. This involves imagining or creating mental pictures to help you relax. Practicing yoga. Through yoga poses, you can lower tension and relax.   Medicines Medicines for anxiety include: Antidepressant  medicines. These are usually prescribed for long-term daily control. Anti-anxiety medicines. These may be added in severe cases, especially when panic attacks occur. When used together, medicines, psychotherapy, and tension-reduction techniques may be the most effective treatment. Relationships Relationships can play a big part in  helping you recover. Spend more time connecting with trusted friends and family members. Think about going to couples counseling if you have a partner, taking family education classes, or going to family therapy. Therapy can help you and others better understand your anxiety. How to recognize changes in your anxiety Everyone responds differently to treatment for anxiety. Recovery from anxiety happens when symptoms lessen and stop interfering with your daily life at home or work. This may mean that you will start to: Have better concentration and focus. Worry will interfere less in your daily thinking. Sleep better. Be less irritable. Have more energy. Have improved memory. Try to recognize when your condition is getting worse. Contact your provider if your symptoms interfere with home or work and you feel like your condition is not improving. Follow these instructions at home: Activity Exercise. Adults should: Exercise for at least 150 minutes each week. The exercise should increase your heart rate and make you sweat (moderate-intensity exercise). Do strengthening exercises at least twice a week. Get the right amount and quality of sleep. Most adults need 7-9 hours of sleep each night. Lifestyle  Eat a healthy diet that includes plenty of vegetables, fruits, whole grains, low-fat dairy products, and lean protein. Do not eat a lot of foods that are high in fats, added sugars, or salt (sodium). Make choices that simplify your life. Do not use any products that contain nicotine or tobacco. These products include cigarettes, chewing tobacco, and vaping devices, such as  e-cigarettes. If you need help quitting, ask your provider. Avoid caffeine, alcohol, and certain over-the-counter cold medicines. These may make you feel worse. Ask your pharmacist which medicines to avoid. General instructions Take over-the-counter and prescription medicines only as told by your provider. Keep all follow-up visits. This is to make sure you are managing your anxiety well or if you need more support. Where to find support You can get help and support from: Self-help groups. Online and [employer]. A trusted spiritual leader. Couples counseling. Family education classes. Family therapy. Where to find more information You may find that joining a support group helps you deal with your anxiety. The following sources can help you find counselors or support groups near you: Mental Health America: mentalhealthamerica.net Anxiety and Depression Association of America (ADAA): adaa.org The [employer] on Mental Illness (NAMI): nami.org Contact a health care provider if: You have a hard time staying focused or finishing tasks. You spend many hours a day feeling worried about everyday life. You are very tired because you cannot stop worrying. You start to have headaches or often feel tense. You have chronic nausea or diarrhea. Get help right away if: Your heart feels like it is racing. You have shortness of breath. You have thoughts of hurting yourself or others. Get help right away if you feel like you may hurt yourself or others, or have thoughts about taking your own life. Go to your nearest emergency room or: Call 911. Call the National Suicide Prevention Lifeline at 671-620-4916 or 988. This is open 24 hours a day. Text the Crisis Text Line at 424-525-4821. This information is not intended to replace advice given to you by your health care provider. Make sure you discuss any questions you have with your health care provider. Document Revised: 08/31/2022 Document  Reviewed: 03/15/2021 Elsevier Patient Education  2024 Elsevier Inc. Mindfulness-Based Stress Reduction: What to Know Mindfulness-based stress reduction (MBSR) is a mindfulness meditation program that normally takes place over 8 weeks. It usually includes weekly group  classes and daily exercises to do at home. What are the benefits of MBSR? Mindfulness meditation therapies, like MBSR, can change a person's brain and body in good ways, and make them healthier. MBSR can have many benefits, such as: Helping to lower stress hormones. Decreasing symptoms or helping to deal with symptoms of different conditions, like: Anxiety, which is feeling worried or nervous. Long-lasting pain. This is pain that lasts more than 3 months. Stress and worry. Trouble sleeping. Headaches, like migraines and tension headaches. Irritable bowel syndrome. Helping to handle stress from things you can't control, like: Long-term illnesses, especially if you have a lot of pain or other difficult symptoms. Big life events. Stress at work. Stress from taking care of someone else. Types of MBSR exercises Mindfulness. This is a common type of meditation. Meditation. It helps you focus your mind to feel calm and happy. It has two main parts: paying attention and accepting. Paying attention means focusing on what is happening right now. This usually means noticing your breathing, your thoughts, how your body feels, and your emotions. Accepting means noticing these feelings and sensations without judging them. Instead of reacting to these thoughts or feelings, you just observe them and let them pass. MSBR exercises include: Body scanning. This is a mindfulness exercise where you pay attention to how different parts of your body feel. You can do this while lying down or sitting up. Sitting meditations. In this exercise, you focus on something like your breathing. When your mind starts to wander, gently bring it back to your  breath. Keep doing this every time you notice your mind wandering. Mindful movements. This exercise involves moving and stretching your body slowly while paying attention to how it feels. Mindful Tasks. This means paying attention to how your body feels while doing things like walking or eating. Follow these instructions at home:  Find an in-person MBSR program or find a program that is online. Find a podcast or recording that provides guidance for MSBR exercises. Look for a therapist who knows how to use MBSR. Follow your treatment plan as told by your health care provider. This may include taking regular medicines and making changes to your diet or lifestyle. Where to find more information You can find more information about MBSR from: Your provider. Community-based meditation centers or programs. American Psychological Association at http://forbes-duran.com/. This information is not intended to replace advice given to you by your health care provider. Make sure you discuss any questions you have with your health care provider. Document Revised: 01/26/2024 Document Reviewed: 01/26/2024 Elsevier Patient Education  2025 Elsevier Inc. Anemia  Anemia is a condition in which there are not enough red blood cells or hemoglobin in the blood. Hemoglobin is a substance in red blood cells that carries oxygen. When you do not have enough red blood cells or hemoglobin (are anemic), your body cannot get enough oxygen, and your organs may not work properly. As a result, you may feel very tired or have other problems. What are the causes? Common causes of anemia include: Excessive bleeding. Anemia can be caused by excessive bleeding inside or outside the body, including bleeding from the intestines or from heavy menstrual periods in females. Poor nutrition. Long-lasting (chronic) kidney, thyroid , and liver disease. Bone marrow disorders, spleen problems, and blood  disorders. Cancer and treatments for cancer. Human immunodeficiency virus (HIV) and acquired immunodeficiency syndrome (AIDS). Infections, medicines, and autoimmune disorders that destroy red blood cells. What are the signs or symptoms? Symptoms of this  condition include: Minor weakness. Dizziness. Headache, or difficulties concentrating and sleeping. Heartbeats that feel irregular or faster than normal (palpitations). Shortness of breath, especially with exercise. Pale skin, lips, and nails, or cold hands and feet. Upset stomach (indigestion) and nausea. Symptoms may occur suddenly or develop slowly. If your anemia is mild, you may not have symptoms. How is this diagnosed? This condition is diagnosed based on blood tests, your medical history, and a physical exam. In some cases, a test may be needed in which cells are removed from the soft tissue inside of a bone and looked at under a microscope (bone marrow biopsy). Your health care provider may also check your stool (feces) for blood and may do more testing to look for the cause of your bleeding. Other tests may include: Imaging tests, such as a CT scan or MRI. A procedure to see inside your esophagus and stomach (endoscopy). The esophagus is the part of the body that moves food from your mouth to your stomach. A procedure to see inside your colon and rectum (colonoscopy). How is this treated? Treatment for this condition depends on the cause. If you continue to lose a lot of blood, you may need to be treated at a hospital. Treatment may include: Taking supplements of iron, vitamin B12, or folic acid . Taking a hormone medicine (erythropoietin) that can help to stimulate red blood cell growth. Receiving donated blood through an IV (blood transfusion). This may be needed if you lose a lot of blood. Making changes to your diet. Having surgery to remove your spleen. Follow these instructions at home: Take over-the-counter and prescription  medicines only as told by your health care provider. Take supplements only as told by your health care provider. Follow any diet instructions that you were given by your health care provider. Keep all follow-up visits. Your health care provider will want to recheck your blood tests. Contact a health care provider if: You develop new bleeding anywhere in the body. You are very weak. Get help right away if: You are short of breath. You have pain in your abdomen or chest. You are dizzy or feel faint. You have trouble concentrating. You have bloody stools, black stools, or tarry stools. You vomit repeatedly or you vomit up blood. These symptoms may be an emergency. Get help right away. Call 911. Do not wait to see if the symptoms will go away. Do not drive yourself to the hospital. Summary Anemia is a condition in which you do not have enough red blood cells or enough of a substance in your red blood cells that carries oxygen. Symptoms may occur suddenly or develop slowly. If your anemia is mild, you may not have symptoms. This condition is diagnosed with blood tests, a medical history, and a physical exam. Other tests may be needed. Treatment for this condition depends on the cause of the anemia. This information is not intended to replace advice given to you by your health care provider. Make sure you discuss any questions you have with your health care provider. Document Revised: 02/15/2022 Document Reviewed: 02/15/2022 Elsevier Patient Education  2024 Elsevier Inc. Managing Depression, Adult Depression is a mental health condition that affects your thoughts, feelings, and actions. Being diagnosed with depression can bring you relief if you did not know why you have felt or behaved a certain way. It could also leave you feeling overwhelmed. Finding ways to manage your symptoms can help you feel more positive about your future. How to manage lifestyle changes  Being depressed is difficult.  Depression can increase the level of everyday stress. Stress can make depression symptoms worse. You may believe your symptoms cannot be managed or will never improve. However, there are many things you can try to help manage your symptoms. There is hope. Managing stress  Stress is your body's reaction to life changes and events, both good and bad. Stress can add to your feelings of depression. Learning to manage your stress can help lessen your feelings of depression. Try some of the following approaches to reducing your stress (stress reduction techniques): Listen to music that you enjoy and that inspires you. Try using a meditation app or take a meditation class. Develop a practice that helps you connect with your spiritual self. Walk in nature, pray, or go to a place of worship. Practice deep breathing. To do this, inhale slowly through your nose. Pause at the top of your inhale for a few seconds and then exhale slowly, letting yourself relax. Repeat this three or four times. Practice yoga to help relax and work your muscles. Choose a stress reduction technique that works for you. These techniques take time and practice to develop. Set aside 5-15 minutes a day to do them. Therapists can offer training in these techniques. Do these things to help manage stress: Keep a journal. Know your limits. Set healthy boundaries for yourself and others, such as saying no when you think something is too much. Pay attention to how you react to certain situations. You may not be able to control everything, but you can change your reaction. Add humor to your life by watching funny movies or shows. Make time for activities that you enjoy and that relax you. Spend less time using electronics, especially at night before bed. The light from screens can make your brain think it is time to get up rather than go to bed.   Medicines Medicines, such as antidepressants, are often a part of treatment for  depression. Talk with your pharmacist or health care provider about all the medicines, supplements, and herbal products that you take, their possible side effects, and what medicines and other products are safe to take together. Make sure to report any side effects you may have to your health care provider. Relationships Your health care provider may suggest family therapy, couples therapy, or individual therapy as part of your treatment. How to recognize changes Everyone responds differently to treatment for depression. As you recover from depression, you may start to: Have more interest in doing activities. Feel more hopeful. Have more energy. Eat a more regular amount of food. Have better mental focus. It is important to recognize if your depression is not getting better or is getting worse. The symptoms you had in the beginning may return, such as: Feeling tired. Eating too much or too little. Sleeping too much or too little. Feeling restless, agitated, or hopeless. Trouble focusing or making decisions. Having unexplained aches and pains. Feeling irritable, angry, or aggressive. If you or your family members notice these symptoms coming back, let your health care provider know right away. Follow these instructions at home: Activity Try to get some form of exercise each day, such as walking. Try yoga, mindfulness, or other stress reduction techniques. Participate in group activities if you are able. Lifestyle Get enough sleep. Cut down on or stop using caffeine, tobacco, alcohol, and any other harmful substances. Eat a healthy diet that includes plenty of vegetables, fruits, whole grains, low-fat dairy products, and lean protein. Limit  foods that are high in solid fats, added sugar, or salt (sodium). General instructions Take over-the-counter and prescription medicines only as told by your health care provider. Keep all follow-up visits. It is important for your health care  provider to check on your mood, behavior, and medicines. Your health care provider may need to make changes to your treatment. Where to find support Talking to others  Friends and family members can be sources of support and guidance. Talk to trusted friends or family members about your condition. Explain your symptoms and let them know that you are working with a health care provider to treat your depression. Tell friends and family how they can help. Finances Find mental health providers that fit with your financial situation. Talk with your health care provider if you are worried about access to food, housing, or medicine. Call your insurance company to learn about your co-pays and prescription plan. Where to find more information You can find support in your area from: Anxiety and Depression Association of America (ADAA): adaa.org Mental Health America: mentalhealthamerica.net The [employer] on Mental Illness: nami.org Contact a health care provider if: You stop taking your antidepressant medicines, and you have any of these symptoms: Nausea. Headache. Light-headedness. Chills and body aches. Not being able to sleep (insomnia). You or your friends and family think your depression is getting worse. Get help right away if: You have thoughts of hurting yourself or others. Get help right away if you feel like you may hurt yourself or others, or have thoughts about taking your own life. Go to your nearest emergency room or: Call 911. Call the National Suicide Prevention Lifeline at 801-436-3165 or 988. This is open 24 hours a day. Text the Crisis Text Line at 907-686-4457. This information is not intended to replace advice given to you by your health care provider. Make sure you discuss any questions you have with your health care provider. Document Revised: 03/30/2022 Document Reviewed: 03/30/2022 Elsevier Patient Education  2024 Elsevier Inc. Depression with the Seasons (Seasonal  Affective Disorder): What to Know Seasonal Affective Disorder (SAD) is a kind of depression. It makes you feel depressed or sad at certain times of the year. Even though SAD can sometimes happen in the spring or summer, it usually happens in late fall and winter when the days are shorter and people spend less time outside. This is why it's also called the winter blues. SAD can be mild or severe and it can make it hard to work, go to school, have good relationships, and do daily activities. What are the causes? The cause of SAD is not known. It might be because of changes in brain chemicals when there is more or less daylight. What increases the risk? You are more likely to develop SAD if: You are female. You live far away from the equator, where there is less sunlight and longer winters. You have depression or bipolar disorder. Someone in your family has or has had mental health problems. What are the signs or symptoms? Symptoms of SAD include: Mood changes, such as: Feeling sad or crying a lot. Getting angry easier or faster than usual. Feeling guilty or worthless. Thinking about hurting yourself or attempting suicide. Physical changes, such as: Feeling restless or very tired. Having trouble concentrating or making decisions. Having a big change in appetite or weight. Behavioral changes, such as: Having trouble sleeping or sleeping too much. Losing interest in things you usually enjoy. Eating too much or craving sweet foods. Avoiding people  and wanting to be alone. Symptoms of summer SAD include: Not feeling hungry. Losing weight. Having trouble sleeping. Being very aggressive or violent, in severe cases. How is this diagnosed? A health care provider or mental health specialist may ask about your feelings, thoughts, and actions, and if you notice a pattern with the seasons. They may also ask about: Your medical history. If you have had any big changes in your life. Any  medicines you take. Any substances you use. You might get a physical exam and blood tests to make sure nothing else is causing your symptoms, like thyroid  problems, low blood sugar, or an infection. How is this treated? Usually, SAD symptoms get better on their own when the seasons change, but treatment can help with feeling better faster, especially if the symptoms are really bad. Examples of treatments and things you can do to help with SAD symptoms include: Having light therapy or taking steps to be exposed to more light (for fall or winter SAD), like: Using a light box designed specifically to treat SAD. Using a Scott simulator or sunrise clock. This is a timer-activated light source that copies the sunrise by slowly becoming brighter. This can help to activate your body's internal clock. Making your home and workplace bright and sunny. Open the window blinds. Move your furniture closer to the windows to get more natural light. Trying to spend time outside if you can. But, remember to talk to your provider about the good and bad things about being in the sun. Doing cognitive behavioral therapy (CBT). CBT is a form of talk therapy that helps to identify and change negative thoughts that are associated with SAD. Taking medicine, like an antidepressant. Doing things to stay healthy and feel good, such as: Exercise regularly, Eat healthy foods. Get enough sleep. Stay busy and connected with other people by helping others, joining group activities, and spending time with friends and family. Follow these instructions at home: Medicines Take your medicines only as told. If you take medicine for SAD, avoid using alcohol and other substances because they may prevent your medicine from working correctly. Talk with your pharmacist or provider about: All the medicines that you take. Possible side effects. What medicines are safe to take together,. Ask you provider about when you should expect for  your symptoms to get better. Antidepressants take a while to start working. Find out which side effects or symptoms are so serious you should call the provider. Talk to your provider before you start taking any new prescription medicines, medicines you can buy at the store, herbs, or supplements. General instructions Get enough sleep. To improve your sleep, try: Keeping your bedroom dark and cool. Going to bed and waking up at the same time every day. Limiting your screen time starting a few hours before bedtime. Exercise regularly. Eat healthy foods like fruits, vegetables, whole grains, and lean proteins. Keep all follow-up visits. Your provider will need to monitor your symptoms and treatment response. Where to find more information To learn more, go to these websites: American Psychological Association: tropicalcloud.ca American Psychiatric Association: psychiatry.[employer] of Mental Health: bloggercourse.com Then: Click the search box. Type Seasonal Affective Disorder in the search box and find the link you need. Contact a health care provider if: Your symptoms don't get better or they get worse. You have trouble taking care of yourself. You are using alcohol or other substances to cope with your symptoms. You have side effects from medicines. Get help right away if:  You feel like you may hurt yourself or others. You have thoughts about taking your own life. You have other thoughts or feelings that worry you. These symptoms may be an emergency. Take one of these steps right away: Go to your nearest emergency room. Call 911. Contact the 988 Suicide & Crisis Lifeline (24/7, free and confidential): Call or text 988. Chat online at chat.newsactor.se. For Veterans and their loved ones: Call 988 and press 1. Text the [employer] at 2265311964. Chat online at reservationslist.si. This information is not intended to replace advice given to you by your health care  provider. Make sure you discuss any questions you have with your health care provider. Document Revised: 02/07/2024 Document Reviewed: 02/06/2024 Elsevier Patient Education  2025 [employer].  The above assessment and management plan was discussed with the patient. The patient verbalized understanding of and has agreed to the management plan. Patient is aware to call the clinic if they develop any new symptoms or if symptoms persist or worsen. Patient is aware when to return to the clinic for a follow-up visit. Patient educated on when it is appropriate to go to the emergency department.  Nevaeha Finerty [place] Thompson, DNP [facility] [address] [place], [place] [address] [phone]

## 2024-10-24 ENCOUNTER — Ambulatory Visit: Payer: Self-pay | Admitting: Nurse Practitioner

## 2024-10-24 LAB — HEPB+HEPC+HIV PANEL
HIV Screen 4th Generation wRfx: NONREACTIVE
Hep B E Ab: NONREACTIVE
Hep B Surface Ab, Qual: REACTIVE
Hep C Virus Ab: NONREACTIVE

## 2024-10-24 LAB — CBC WITH DIFFERENTIAL/PLATELET
Basophils Absolute: 0 x10E3/uL (ref 0.0–0.2)
Basos: 1 %
EOS (ABSOLUTE): 0.2 x10E3/uL (ref 0.0–0.4)
Eos: 4 %
Hematocrit: 38.4 % (ref 34.0–46.6)
Hemoglobin: 12.3 g/dL (ref 11.1–15.9)
Immature Grans (Abs): 0 x10E3/uL (ref 0.0–0.1)
Immature Granulocytes: 0 %
Lymphocytes Absolute: 2 x10E3/uL (ref 0.7–3.1)
Lymphs: 37 %
MCH: 26.8 pg (ref 26.6–33.0)
MCHC: 32 g/dL (ref 31.5–35.7)
MCV: 84 fL (ref 79–97)
Monocytes Absolute: 0.4 x10E3/uL (ref 0.1–0.9)
Monocytes: 7 %
Neutrophils Absolute: 2.7 x10E3/uL (ref 1.4–7.0)
Neutrophils: 51 %
Platelets: 301 x10E3/uL (ref 150–450)
RBC: 4.59 x10E6/uL (ref 3.77–5.28)
RDW: 12.8 % (ref 11.7–15.4)
WBC: 5.3 x10E3/uL (ref 3.4–10.8)

## 2024-10-24 LAB — CMP14+EGFR
ALT: 21 IU/L (ref 0–32)
AST: 32 IU/L (ref 0–40)
Albumin: 4.5 g/dL (ref 3.9–4.9)
Alkaline Phosphatase: 66 IU/L (ref 41–116)
BUN/Creatinine Ratio: 8 — AB (ref 9–23)
BUN: 7 mg/dL (ref 6–20)
Bilirubin Total: 0.5 mg/dL (ref 0.0–1.2)
CO2: 20 mmol/L (ref 20–29)
Calcium: 9.3 mg/dL (ref 8.7–10.2)
Chloride: 103 mmol/L (ref 96–106)
Creatinine, Ser: 0.83 mg/dL (ref 0.57–1.00)
Globulin, Total: 2.8 g/dL (ref 1.5–4.5)
Glucose: 98 mg/dL (ref 70–99)
Potassium: 4.1 mmol/L (ref 3.5–5.2)
Sodium: 138 mmol/L (ref 134–144)
Total Protein: 7.3 g/dL (ref 6.0–8.5)
eGFR: 94 mL/min/1.73 (ref >59)

## 2024-10-24 LAB — LIPID PANEL
Cholesterol, Total: 158 mg/dL (ref 100–199)
HDL: 51 mg/dL (ref >39)
LDL CALC COMMENT:: 3.1 ratio (ref 0.0–4.4)
LDL Chol Calc (NIH): 96 mg/dL (ref 0–99)
Triglycerides: 54 mg/dL (ref 0–149)
VLDL Cholesterol Cal: 11 mg/dL (ref 5–40)

## 2024-10-24 LAB — THYROID PANEL WITH TSH
Free Thyroxine Index: 1.9 (ref 1.2–4.9)
T3 Uptake Ratio: 27 % (ref 24–39)
T4, Total: 6.9 ug/dL (ref 4.5–12.0)
TSH: 0.842 u[IU]/mL (ref 0.450–4.500)

## 2024-12-16 ENCOUNTER — Encounter (HOSPITAL_COMMUNITY): Payer: Self-pay | Admitting: Emergency Medicine

## 2024-12-16 ENCOUNTER — Emergency Department (HOSPITAL_COMMUNITY)
Admission: EM | Admit: 2024-12-16 | Discharge: 2024-12-16 | Disposition: A | Discharge status: Home / Self Care | Attending: Emergency Medicine | Admitting: Emergency Medicine

## 2024-12-16 ENCOUNTER — Other Ambulatory Visit: Payer: Self-pay

## 2024-12-16 DIAGNOSIS — Z760 Encounter for issue of repeat prescription: Secondary | ICD-10-CM | POA: Insufficient documentation

## 2024-12-16 MED ORDER — BUPRENORPHINE HCL-NALOXONE HCL 8-2 MG SL SUBL
1.0000 | SUBLINGUAL_TABLET | Freq: Once | SUBLINGUAL | Status: AC
  Administered 2024-12-16: 1 via SUBLINGUAL
  Filled 2024-12-16: qty 1

## 2024-12-16 MED ORDER — BUPRENORPHINE HCL-NALOXONE HCL 8-2 MG SL FILM: 1.0000 | ORAL_FILM | Freq: Two times a day (BID) | SUBLINGUAL | 0 refills | Status: AC

## 2024-12-16 NOTE — ED Provider Notes (Signed)
 "  [facility] AT [facility]  Provider Note  CSN: [id number] Arrival date & time: 12/16/24 0246  History No chief complaint on file.   Kara Scott is a 34 y.o. female with reported history of lupus is on Suboxone  for chronic pain, last filled a 7 day Rx on 12/29 and ran out 2 days ago. She is not due to see her doctor until Tuesday for next refill and is experiencing some mild withdrawal symptoms.    Home Medications Prior to Admission medications  Medication Sig Start Date End Date Taking? Authorizing Provider  Buprenorphine  HCl-Naloxone  HCl 8-2 MG FILM Take 1 Film by mouth 2 (two) times daily for 2 days. 12/16/24 12/18/24  Roselyn Carlin NOVAK, MD  carbamide peroxide (DEBROX) 6.5 % OTIC solution Place 5 drops into both ears 2 (two) times daily. 10/23/24   [facility] Pool, NP  cetirizine  (ZYRTEC ) 10 MG tablet Take 1 tablet (10 mg total) by mouth daily. 10/23/24   [facility] Pool, NP  Elastic Bandages & Supports (FUTURO REVERSIBLE WRIST BRACE) MISC Left wrist brace. 10/04/23   [provider]  escitalopram  (LEXAPRO ) 10 MG tablet Take 1 tablet (10 mg total) by mouth daily. Take with 5 mg for a total 15 mg daily 10/23/24   [place] Thompson, Pool, NP  escitalopram  (LEXAPRO ) 5 MG tablet Take 1 tablet (5 mg total) by mouth daily. Take with 10 mg for a total 15 mg daily 10/23/24   [place] Thompson, Sandra, NP     Allergies    Patient has no known allergies.   Review of Systems   Review of Systems Please see HPI for pertinent positives and negatives  Physical Exam BP (!) 143/81 (BP Location: Left Arm)   Pulse 63   Temp 98 F (36.7 C) (Oral)   Resp 16   Ht 5' 4 (1.626 m)   Wt 56.7 kg   LMP 12/13/2024 (Exact Date)   SpO2 98%   BMI 21.46 kg/m   Physical Exam Vitals and nursing note reviewed.  HENT:     Head: Normocephalic.     Nose: Nose normal.  Eyes:     Extraocular Movements: Extraocular movements intact.   Pulmonary:     Effort: Pulmonary effort is normal.  Musculoskeletal:        General: Normal range of motion.     Cervical back: Neck supple.  Skin:    Findings: No rash (on exposed skin).  Neurological:     Mental Status: She is alert and oriented to person, place, and time.  Psychiatric:        Mood and Affect: Mood normal.     ED Results / Procedures / Treatments   EKG None  Procedures Procedures  Medications Ordered in the ED Medications  buprenorphine -naloxone  (SUBOXONE ) 8-2 mg per SL tablet 1 tablet (has no administration in time range)    Initial Impression and Plan  Patient here with mild suboxone  withdrawal, well appearing with reassuring exam and vitals. Will give a short Rx for 2 days to bridge her until next refill. She denies having a pain contract. RTED for any other concerns.   ED Course       MDM Rules/Calculators/A&P Medical Decision Making Problems Addressed: Medicine refill: self-limited or minor problem  Risk Prescription drug management.     Final Clinical Impression(s) / ED Diagnoses Final diagnoses:  Medicine refill    Rx / DC Orders ED Discharge Orders  Ordered    Buprenorphine  HCl-Naloxone  HCl 8-2 MG FILM  2 times daily        12/16/24 0313             Roselyn Carlin NOVAK, MD 12/16/24 [phone]  "

## 2024-12-16 NOTE — ED Triage Notes (Signed)
 Pt states she has been having Suboxone  withdrawals (sweating, anxiety, joint pain) x 2 days. Pt states she is not scheduled for a refill until Tuesday.

## 2024-12-31 ENCOUNTER — Encounter: Payer: Self-pay | Admitting: *Deleted

## 2025-04-19 ENCOUNTER — Ambulatory Visit: Admitting: Family Medicine

## 2025-04-22 ENCOUNTER — Ambulatory Visit: Admitting: Nurse Practitioner
